# Patient Record
Sex: Male | Born: 1935 | Race: White | Hispanic: No | Marital: Married | Smoking: Former smoker
Health system: Southern US, Community
[De-identification: ages and names within clinical notes are randomized; demographics above are authoritative.]

## PROBLEM LIST (undated history)

## (undated) DIAGNOSIS — F32A Depression, unspecified: Secondary | ICD-10-CM

## (undated) DIAGNOSIS — T4145XA Adverse effect of unspecified anesthetic, initial encounter: Secondary | ICD-10-CM

## (undated) DIAGNOSIS — I499 Cardiac arrhythmia, unspecified: Secondary | ICD-10-CM

## (undated) DIAGNOSIS — N529 Male erectile dysfunction, unspecified: Secondary | ICD-10-CM

## (undated) DIAGNOSIS — I739 Peripheral vascular disease, unspecified: Secondary | ICD-10-CM

## (undated) DIAGNOSIS — T8859XA Other complications of anesthesia, initial encounter: Secondary | ICD-10-CM

## (undated) DIAGNOSIS — K219 Gastro-esophageal reflux disease without esophagitis: Secondary | ICD-10-CM

## (undated) DIAGNOSIS — F329 Major depressive disorder, single episode, unspecified: Secondary | ICD-10-CM

## (undated) HISTORY — DX: Major depressive disorder, single episode, unspecified: F32.9

## (undated) HISTORY — PX: APPENDECTOMY: SHX54

## (undated) HISTORY — PX: EYE SURGERY: SHX253

## (undated) HISTORY — DX: Peripheral vascular disease, unspecified: I73.9

## (undated) HISTORY — DX: Male erectile dysfunction, unspecified: N52.9

## (undated) HISTORY — DX: Depression, unspecified: F32.A

## (undated) HISTORY — DX: Gastro-esophageal reflux disease without esophagitis: K21.9

---

## 2003-03-10 ENCOUNTER — Ambulatory Visit: Admission: RE | Admit: 2003-03-10 | Discharge: 2003-03-10 | Payer: Self-pay | Admitting: Family Medicine

## 2010-07-28 DIAGNOSIS — K219 Gastro-esophageal reflux disease without esophagitis: Secondary | ICD-10-CM | POA: Insufficient documentation

## 2010-07-28 DIAGNOSIS — E7800 Pure hypercholesterolemia, unspecified: Secondary | ICD-10-CM

## 2010-07-28 DIAGNOSIS — F411 Generalized anxiety disorder: Secondary | ICD-10-CM | POA: Insufficient documentation

## 2010-07-28 DIAGNOSIS — I10 Essential (primary) hypertension: Secondary | ICD-10-CM | POA: Insufficient documentation

## 2010-07-28 DIAGNOSIS — F329 Major depressive disorder, single episode, unspecified: Secondary | ICD-10-CM

## 2010-07-28 DIAGNOSIS — I739 Peripheral vascular disease, unspecified: Secondary | ICD-10-CM | POA: Insufficient documentation

## 2010-07-28 DIAGNOSIS — N529 Male erectile dysfunction, unspecified: Secondary | ICD-10-CM

## 2010-07-29 ENCOUNTER — Ambulatory Visit: Payer: Self-pay | Admitting: Pulmonary Disease

## 2010-07-29 DIAGNOSIS — J984 Other disorders of lung: Secondary | ICD-10-CM

## 2011-01-18 NOTE — Assessment & Plan Note (Signed)
Summary: consult for pulmonary nodules   Copy to:  [name] Primary [name]/Referring [name]:  [name]  CC:  Pulmonary Consult.  History of Present Illness: The pt is a 77 y/o male who I have been asked to see for pulmonary nodules.  He has had a recent cxr, which showed pulmonary nodules, and his film has been reviewed on disc.  He denies any pulmonary symptoms such as cough, chest pain, hemoptysis.  He is eating well, and has not had any weight loss.  He does have a long h/o tobacco abuse, and has changed to cigars since 1994.  He denies any h/o TB exposure, and has unknown PPD status.  He has never lived in [place] or [place].  He did work for a 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    Current Medications (verified): 1)  Blood Pressure Medicine (Pt Unsure of The Name) .... Take 1 Tablet By Mouth Once A Day  Allergies (verified): No Known Drug Allergies  Past History:  Past Medical History: Reviewed history from 07/28/2010 and no changes required. Current Problems:  GERD (ICD-530.81) ANXIETY (ICD-300.00) DEPRESSION (ICD-311) ERECTILE DYSFUNCTION, ORGANIC (ICD-607.84) PERIPHERAL VASCULAR DISEASE (ICD-443.9) HYPERTENSION (ICD-401.9) HYPERCHOLESTEROLEMIA (ICD-272.0)    Past Surgical History: appendectomy at age 82  Family History: Reviewed history and no changes required. cancer: father (lung) deceased at age 14  Social History: Reviewed history from 07/28/2010 and no changes required. former smoker.  quit smoking cigarettes in 1994.  started at age 64.  2 ppd.  now smokes 5 cigars daily pt is retired from [employer]. pt is married. pt has children:  2 boys and 3 girls  Review of Systems       The patient complains of loss of appetite and sneezing.  The patient denies shortness of breath with activity, shortness of breath at rest, productive cough, non-productive cough, coughing up blood, chest pain, irregular  heartbeats, acid heartburn, indigestion, weight change, abdominal pain, difficulty swallowing, sore throat, tooth/dental problems, headaches, nasal congestion/difficulty breathing through nose, itching, ear ache, anxiety, depression, hand/feet swelling, joint stiffness or pain, rash, change in color of mucus, and fever.    Vital Signs:  Patient profile:   77 year old male Height:      71 inches Weight:      169 pounds BMI:     23.66 O2 Sat:      96 % on Room air Temp:     98.0 degrees F oral Pulse rate:   70 / minute BP sitting:   114 / 80  (left arm) Cuff size:   regular  Vitals Entered By: Arman Filter LPN (July 29, 2010 9:28 AM)  O2 Flow:  Room air CC: Pulmonary Consult Comments Medications reviewed with patient Arman Filter LPN  July 29, 2010 9:28 AM    Physical Exam  General:  wd male in nad Eyes:  PERRLA and EOMI.   Nose:  patent without discharge Mouth:  clear, with no lesions or exudates. Neck:  no jvd, tmg, LN Lungs:  decreased bs, but no wheezing or rhonchi Heart:  irregular rhythm, but controlled ventricular response. Abdomen:  soft and nontender, bs+ Extremities:  no signficant edema, pulses intact no cyanosis Neurologic:  alert and oriented, moves all 4.   Impression & Recommendations:  Problem # 1:  PULMONARY NODULE (ICD-518.89) the pt has pulmonary nodule on recent cxr of unknown significance.  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  I have recommended to the pt that he have a ct chest to better characterize the abnormality, especially given his long smoking history.  Once we have an idea of what we are dealing with, can followup further by chest xray.  Will also need to make sure his health maintenance is up to date.  The pt is hesitant to have ct chest, but will think about it and let me know.  If he decides against, will need a followup cxr in 6mos.    Medications Added to  Medication List This Visit: 1)  Blood Pressure Medicine (pt Unsure of The Name)  .... Take 1 tablet by mouth once a day  Other Orders: Consultation Level IV (10272)  Patient Instructions: 1)  consider doing ct scan of chest for evaluation, and let me know.  if you decide against doing so, will need followup cxr in 6mos.  Please call the office to arrange in Feb 2012. 2)  please call if any change in your pulmonary symptoms

## 2011-01-19 ENCOUNTER — Encounter: Payer: Self-pay | Admitting: Pulmonary Disease

## 2011-01-27 ENCOUNTER — Telehealth: Payer: Self-pay | Admitting: Pulmonary Disease

## 2011-02-03 NOTE — Progress Notes (Addendum)
Summary: schedule cxr/ct  ---- Converted from flag ---- ---- 01/27/2011 1:05 PM, Barbaraann Share MD wrote: Edward Millet, let [name] know that he is due for a f/u cxr for his lung spots.  I still think he needs ct, but he did not want to do so.  see if you can talk him into it. ------------------------------  [id number],..... Edward Millet Reynolds LPN  January 27, 2011 4:13 PM  called and spoke with [name].  [name] states his pcp, Dr. [name], called him and scheduled him for the 6 month f/u cxr which [name] recently had.  [name] states he was told there was no change in cxrs.  Called Dr. Michaelle Copas office and requested cxr report.  Edward Millet Reynolds LPN  January 28, 2011 8:56 AM    received cxr results and put in [name]'s very important look at folder.  Edward Millet Reynolds LPN  January 28, 2011   [name] needs a repeat cxr in 6mos.    2:03 PM   Appended Document: schedule cxr/ct please let [name] know that he needs a f/u cxr in 6mos.  Appended Document: schedule cxr/ct called and spoke with [name].  [name] aware of [name]'s recs to schedule f/u cxr in 6 months.  [name] WCB to schedule this.

## 2011-08-04 ENCOUNTER — Ambulatory Visit (HOSPITAL_COMMUNITY)
Admission: RE | Admit: 2011-08-04 | Discharge: 2011-08-04 | Disposition: A | Discharge status: Home / Self Care | Payer: MEDICARE | Source: Ambulatory Visit | Attending: Interventional Cardiology | Admitting: Interventional Cardiology

## 2011-08-04 DIAGNOSIS — I708 Atherosclerosis of other arteries: Secondary | ICD-10-CM | POA: Insufficient documentation

## 2011-08-04 DIAGNOSIS — I70219 Atherosclerosis of native arteries of extremities with intermittent claudication, unspecified extremity: Secondary | ICD-10-CM | POA: Insufficient documentation

## 2011-08-22 ENCOUNTER — Observation Stay (HOSPITAL_COMMUNITY)
Admission: EM | Admit: 2011-08-22 | Discharge: 2011-08-23 | Disposition: A | Discharge status: Home / Self Care | Payer: Medicare Other | Attending: Internal Medicine | Admitting: Internal Medicine

## 2011-08-22 ENCOUNTER — Emergency Department (HOSPITAL_COMMUNITY): Payer: Medicare Other

## 2011-08-22 DIAGNOSIS — F329 Major depressive disorder, single episode, unspecified: Secondary | ICD-10-CM | POA: Insufficient documentation

## 2011-08-22 DIAGNOSIS — D45 Polycythemia vera: Secondary | ICD-10-CM | POA: Insufficient documentation

## 2011-08-22 DIAGNOSIS — R339 Retention of urine, unspecified: Secondary | ICD-10-CM | POA: Insufficient documentation

## 2011-08-22 DIAGNOSIS — F17200 Nicotine dependence, unspecified, uncomplicated: Secondary | ICD-10-CM | POA: Insufficient documentation

## 2011-08-22 DIAGNOSIS — I70209 Unspecified atherosclerosis of native arteries of extremities, unspecified extremity: Secondary | ICD-10-CM | POA: Insufficient documentation

## 2011-08-22 DIAGNOSIS — I10 Essential (primary) hypertension: Secondary | ICD-10-CM | POA: Insufficient documentation

## 2011-08-22 DIAGNOSIS — N3943 Post-void dribbling: Secondary | ICD-10-CM | POA: Insufficient documentation

## 2011-08-22 DIAGNOSIS — F3289 Other specified depressive episodes: Secondary | ICD-10-CM | POA: Insufficient documentation

## 2011-08-22 DIAGNOSIS — E860 Dehydration: Principal | ICD-10-CM | POA: Insufficient documentation

## 2011-08-22 LAB — POCT I-STAT, CHEM 8
BUN: 15 mg/dL (ref 6–23)
Calcium, Ion: 1.08 mmol/L — ABNORMAL LOW (ref 1.12–1.32)
Creatinine, Ser: 1 mg/dL (ref 0.50–1.35)
Glucose, Bld: 130 mg/dL — ABNORMAL HIGH (ref 70–99)
Hemoglobin: 19.4 g/dL — ABNORMAL HIGH (ref 13.0–17.0)
Sodium: 135 mEq/L (ref 135–145)
TCO2: 22 mmol/L (ref 0–100)

## 2011-08-22 LAB — URINALYSIS, ROUTINE W REFLEX MICROSCOPIC
Glucose, UA: 100 mg/dL — AB
Ketones, ur: NEGATIVE mg/dL
Leukocytes, UA: NEGATIVE
Protein, ur: NEGATIVE mg/dL
Urobilinogen, UA: 1 mg/dL (ref 0.0–1.0)

## 2011-08-22 LAB — CK TOTAL AND CKMB (NOT AT ARMC)
CK, MB: 2.3 ng/mL (ref 0.3–4.0)
Total CK: 53 U/L (ref 7–232)

## 2011-08-22 LAB — DIFFERENTIAL
Basophils Absolute: 0.1 10*3/uL (ref 0.0–0.1)
Basophils Relative: 1 % (ref 0–1)
Eosinophils Absolute: 0.2 10*3/uL (ref 0.0–0.7)
Eosinophils Relative: 2 % (ref 0–5)
Monocytes Absolute: 0.7 10*3/uL (ref 0.1–1.0)
Monocytes Relative: 7 % (ref 3–12)

## 2011-08-22 LAB — CBC
MCH: 31 pg (ref 26.0–34.0)
MCHC: 36.1 g/dL — ABNORMAL HIGH (ref 30.0–36.0)
RDW: 14.1 % (ref 11.5–15.5)

## 2011-08-22 LAB — POCT I-STAT TROPONIN I: Troponin i, poc: 0.01 ng/mL (ref 0.00–0.08)

## 2011-08-23 LAB — URINE CULTURE
Colony Count: NO GROWTH
Culture: NO GROWTH

## 2011-08-23 NOTE — H&P (Signed)
NAMENICODEMUS, DENK NO.:  [id number]  MEDICAL RECORD NO.:  [id number]  LOCATION:  [facility]                         FACILITY:  [facility]  PHYSICIAN:  Eduard Clos, MDDATE OF BIRTH:  1934-05-23  DATE OF ADMISSION:  08/22/2011 DATE OF DISCHARGE:                             HISTORY & PHYSICAL   PRIMARY CARE PHYSICIAN:  Dario Guardian, MD  CHIEF COMPLAINT:  Dehydration.  HISTORY OF PRESENTING ILLNESS:  A 77 year old male who was recently diagnosed with peripheral artery disease and had a recent abdominal aortogram and pelvic angiogram, was placed on cilostazol.  The patient also has history of hypertension and also depression for which the patient was just recently started a week ago with desvenlafaxine.  Has not been eating well as the patient was getting more depressed.  The patient's wife states that the patient has been depressed over the last few months after his daughter had died and really after the procedure for his peripheral arterial disease a couple of  weeks ago his depression even worsened and he has not been eating well.  He was feeling weak and tired, not urinating enough, so he was brought to ER. In the ER, the patient was found to be orthostatic.  He has already given 4 liters of fluid bolus now and he will be admitted for dehydration.  The patient denies any chest pain, shortness of breath, nausea, vomiting, abdominal pain, dysuria, discharge, or diarrhea.  Denies any loss of consciousness or any dizziness or any focal deficit.  Denies any difficulties seeing, speaking, or swallowing.  PAST MEDICAL HISTORY: 1. Hypertension. 2. Recently diagnosed depression.  Was started on Pristiq. 3. Recently diagnosed peripheral arterial disease, status post     abdominal and pelvic aortogram.  Started on cilostazol.  MEDICATIONS PRIOR TO ADMISSION:  The patient is on cilostazol, lisinopril, Pristiq, fish oil, aspirin.  ALLERGIES:  No known drug  allergies.  FAMILY HISTORY:  Negative for any heart disease, stroke, diabetes.  SOCIAL HISTORY:  The patient is married.  Denies smoking cigarettes, drinking alcohol, or using illegal drugs.  REVIEW OF SYSTEMS:  As present in the history of presenting illness, nothing else significant.  PHYSICAL EXAMINATION:  GENERAL:  The patient examined at bedside, not in acute distress. VITAL SIGNS:  Blood pressure 150/88, pulse is 70 per minute, temperature 97.9, respirations 18 per minute, O2 sat 97%. HEENT:  Anicteric.  No pallor.  No discharge from ears, eyes, nose, and mouth. CHEST:  Bilateral air entry present.  No rhonchi.  No crepitation. HEART:  S1 and S2 heard. ABDOMEN:  Soft, nontender.  Bowel sounds heard. CNS:  The patient alert, awake, and oriented to time, place, and person. Moves upper and lower extremities 5/5. EXTREMITIES:  Peripheral pulses felt.  No edema.  LABORATORY DATA:  Chest x-ray shows emphysematous changes and scattered fibrosis in the lungs.  No evidence of active pulmonary disease.  CBC, WBCs 11, hemoglobin is 19.4, hematocrit is 57, platelets 332. Basic metabolic panel, sodium 135, potassium 3.5, chloride 101, glucose 130, BUN 15, bicarb was 22.  CK is 53, MB is 2.3, troponin 0.01.  UA is negative for nitrites and leukocytes, urine glucose 100,  ketones negative.  ASSESSMENT: 1. Dehydration. 2. Depression. 3. Polycythemia. 4. Peripheral arterial disease. 5. Pulmonary fibrosis.  PLAN: 1. At this time, we will admit the patient to telemetry. 2. For his dehydration, the patient has already received 4 liters of     normal saline.  The patient was found to be orthostatic in the ER.     Presently, we will continue IV fluids.  We will check strict intake     and output and repeat labs again in a.m.  We should watch out for     any urinary retention after his Foley catheter has been removed.     Presently, we feel he was dehydrated and that is the reason he  was     not able to make urine, but if he has difficulty after removing the     Foley, then we may have to also put in for any urinary retention. 3. depression.  The patient was just recently started on Pristiq.  His     depression is probably causing his poor oral intake which may     eventually improve, but if his oral intake does not improve even     after Pristiq, then he needs further workup as an outpatient with     his primary care physician. 4. History of hypertension.  Presently, I am holding off lisinopril     until his blood pressure is more stable.  I will keep the patient     on p.r.n. hydralazine for blood pressure more than 160. 5. Peripheral arterial disease.  We will continue with cilostazol and     aspirin. 6. Polycythemia, probably from severe dehydration.  We will recheck     CBC again tomorrow morning.  I think with the hydration the     hemoglobin will improve.  If it still shows high level, then he     will need further workup on that. 7. Further recommendations as condition evolves.     Eduard Clos, MD    ANK/MEDQ  D:  08/22/2011  T:  08/22/2011  Job:  [id number]  cc:   Dario Guardian, M.D.  Electronically Signed by Midge Minium MD on 08/23/2011 09:50:54 AM

## 2011-09-11 ENCOUNTER — Inpatient Hospital Stay (HOSPITAL_COMMUNITY)
Admission: EM | Admit: 2011-09-11 | Discharge: 2011-09-12 | DRG: 948 | Discharge status: Against Medical Advice | Payer: Medicare Other | Attending: Internal Medicine | Admitting: Internal Medicine

## 2011-09-11 DIAGNOSIS — Z79899 Other long term (current) drug therapy: Secondary | ICD-10-CM

## 2011-09-11 DIAGNOSIS — L509 Urticaria, unspecified: Secondary | ICD-10-CM | POA: Diagnosis present

## 2011-09-11 DIAGNOSIS — F17200 Nicotine dependence, unspecified, uncomplicated: Secondary | ICD-10-CM | POA: Diagnosis present

## 2011-09-11 DIAGNOSIS — F341 Dysthymic disorder: Secondary | ICD-10-CM | POA: Diagnosis present

## 2011-09-11 DIAGNOSIS — K5900 Constipation, unspecified: Secondary | ICD-10-CM | POA: Diagnosis present

## 2011-09-11 DIAGNOSIS — D45 Polycythemia vera: Secondary | ICD-10-CM | POA: Diagnosis present

## 2011-09-11 DIAGNOSIS — I739 Peripheral vascular disease, unspecified: Secondary | ICD-10-CM | POA: Diagnosis present

## 2011-09-11 DIAGNOSIS — R4182 Altered mental status, unspecified: Principal | ICD-10-CM | POA: Diagnosis present

## 2011-09-11 DIAGNOSIS — R040 Epistaxis: Secondary | ICD-10-CM | POA: Diagnosis present

## 2011-09-11 DIAGNOSIS — I10 Essential (primary) hypertension: Secondary | ICD-10-CM | POA: Diagnosis present

## 2011-09-11 DIAGNOSIS — Z7982 Long term (current) use of aspirin: Secondary | ICD-10-CM

## 2011-09-12 ENCOUNTER — Inpatient Hospital Stay (HOSPITAL_COMMUNITY): Payer: Medicare Other

## 2011-09-12 ENCOUNTER — Emergency Department (HOSPITAL_COMMUNITY): Payer: Medicare Other

## 2011-09-12 LAB — COMPREHENSIVE METABOLIC PANEL
ALT: 15 U/L (ref 0–53)
AST: 17 U/L (ref 0–37)
Albumin: 3.9 g/dL (ref 3.5–5.2)
Calcium: 9.4 mg/dL (ref 8.4–10.5)
GFR calc Af Amer: >60 mL/min (ref >60)
Sodium: 135 mEq/L (ref 135–145)
Total Protein: 6.9 g/dL (ref 6.0–8.3)

## 2011-09-12 LAB — POCT I-STAT TROPONIN I: Troponin i, poc: 0 ng/mL (ref 0.00–0.08)

## 2011-09-12 LAB — DIFFERENTIAL
Basophils Absolute: 0.1 10*3/uL (ref 0.0–0.1)
Basophils Relative: 1 % (ref 0–1)
Lymphocytes Relative: 15 % (ref 12–46)
Monocytes Relative: 6 % (ref 3–12)
Neutro Abs: 8 10*3/uL — ABNORMAL HIGH (ref 1.7–7.7)
Neutrophils Relative %: 78 % — ABNORMAL HIGH (ref 43–77)

## 2011-09-12 LAB — RPR: RPR Ser Ql: NONREACTIVE

## 2011-09-12 LAB — CBC
Hemoglobin: 17.2 g/dL — ABNORMAL HIGH (ref 13.0–17.0)
RBC: 5.46 MIL/uL (ref 4.22–5.81)

## 2011-09-12 LAB — PROTIME-INR
INR: 1 (ref 0.00–1.49)
Prothrombin Time: 13.4 seconds (ref 11.6–15.2)

## 2011-09-12 LAB — OCCULT BLOOD, POC DEVICE: Fecal Occult Bld: NEGATIVE

## 2011-09-12 LAB — URINALYSIS, ROUTINE W REFLEX MICROSCOPIC
Bilirubin Urine: NEGATIVE
Hgb urine dipstick: NEGATIVE
Nitrite: NEGATIVE
Specific Gravity, Urine: 1.019 (ref 1.005–1.030)
pH: 5.5 (ref 5.0–8.0)

## 2011-09-12 LAB — AMMONIA: Ammonia: <10 umol/L — ABNORMAL LOW (ref 11–60)

## 2011-09-14 NOTE — Procedures (Signed)
NAME:  ABDULHAMID, OLGIN NO.:  [id number]  MEDICAL RECORD NO.:  [id number]  LOCATION:  [facility]                         FACILITY:  [facility]  PHYSICIAN:  Corky Crafts, MDDATE OF BIRTH:  10/22/1934  DATE OF PROCEDURE:  08/04/2011 DATE OF DISCHARGE:  08/04/2011                   PERIPHERAL VASCULAR INVASIVE PROCEDURE   PRIMARY CARE PHYSICIAN:  Dario Guardian, MD  PROCEDURES PERFORMED: 1. Abdominal aortogram. 2. Pelvic angiogram. 3. Bilateral lower extremity angiogram.  OPERATOR:  Corky Crafts, MD  INDICATIONS:  Bilateral claudication.  PROCEDURE NARRATIVE:  The risks and benefits of the peripheral vascular angiography were explained to the patient and informed consent was obtained.  He was brought to the cath lab.  He was prepped and draped in the usual sterile fashion.  His left groin was infiltrated with 1% lidocaine.  A 5-French sheath was placed into the left common femoral artery using the modified Seldinger technique.  The pigtail catheter was advanced in to the abdominal aorta and a power injection of contrast was performed.  Catheter was withdrawn to the aortoiliac bifurcation and power injection of contrast was performed there.  A bilateral lower extremity runoff was done.  Several isolated iliac artery angiograms were performed as well using the same catheter.  The sheath was removed and manual compression was used to obtain hemostasis.  We chose the left groin because the patient felt like his right leg claudication was worse than the left leg.  FINDINGS: 1. Abdominal aorta shows ectasia in the infrarenal abdominal aorta. 2. There are bilateral single renal arteries.  The left renal artery     is widely patent.  The right renal has a 25% proximal stenosis. 3. Aortoiliac bifurcation is widely patent. 4. Left common iliac is widely patent.  The left external iliac artery     has a 70% stenosis.  The left internal iliac artery is  diffusely     diseased. 5. The right common iliac artery is widely patent.  The right external     iliac artery has a 70% calcified lesion and the right internal     iliac artery is widely patent. 6. Bilateral common femoral arteries are widely patent. 7. Right lower extremity.  The SFA is occluded proximally.  The     profunda femoral artery provides collaterals.  The distal SFA has a     long segment of occlusion.  This vessel reconstitutes at [facility]     canal.  The popliteal artery is patent.  The right posterior tibial     artery is large and patent.  The right anterior tibial artery has     an 80% mid lesion.  The right peroneal artery is patent. 8. Left leg SFA has proximal diffuse disease up to 70% stenosis.  In     the distal SFA, there is an occlusion at [facility] canal.  This is a     significantly shorter occlusion than what is on the right SFA.  The     left posterior tibial artery is widely patent.  The left peroneal     artery is patent.  The left anterior tibial artery is small but     patent.  IMPRESSION: 1. Bilateral  external iliac disease, moderate degree. 2. Long right superficial femoral artery occlusion with preserved 3-     vessel runoff.  There is a right anterior tibial lesion as well. 3. Left leg shows a shorter distal superficial femoral artery     occlusion with more moderate disease proximally.  He has preserved     3-vessel runoff.  RECOMMENDATIONS:  The right SFA lesion is so long.  It seems unlikely that any percutaneous intervention would give a long-lasting result. The left SFA occlusion is more amenable to percutaneous attempt, however, we went in through the left common femoral artery and therefore could not attempt this today.  We will discuss what the patient wishes to do.  For now, will continue medical therapy.  I think Vascular Surgery consultation would be reasonable if he does want to pursue revascularization given the length of the right SFA  occlusion.  He currently has no rest pain and no tissue loss.  This was all explained to the patient and his wife.  At this point, the patient feels like his symptoms are manageable.  He is leery about undergoing any surgery.  I will follow up with him in the office.     Corky Crafts, MD     JSV/MEDQ  D:  08/15/2011  T:  08/15/2011  Job:  [id number]  Electronically Signed by Lance Muss MD on 09/14/2011 01:07:48 PM

## 2011-09-18 NOTE — H&P (Signed)
[name], [name] NO.:  [id number]  MEDICAL RECORD NO.:  [id number]  LOCATION:  4705                         FACILITY:  [facility]  PHYSICIAN:  Eduard Clos, MDDATE OF BIRTH:  08/07/1934  DATE OF ADMISSION:  09/11/2011 DATE OF DISCHARGE:                             HISTORY & PHYSICAL   PRIMARY CARE PHYSICIAN:  Dario Guardian, MD  CHIEF COMPLAINT:  Confusion.  HISTORY OF PRESENTING ILLNESS:  A 77 year old male who was recently seen in the hospital on September 4 after being treated for dehydration and he also was found to have depression, presents with increasing confusion over the last 2 weeks.  The patient's wife was brought in the history states that he is being increasingly confused and sometime talking about bills at home and sometimes not making sense.  In the ER, when he was brought he was found to be slightly agitated, was given Ativan 2 mg IV and became more better.  The patient at this time is more oriented, but I am talking to him, suddenly he speaks something which is not making sense.  Sometimes, he is completely oriented.  He did not have any headache or fever or chills.  Did not lose consciousness or did not have any focal deficit.  He has been having some constipation.  He did not have any nausea, vomiting or any abdominal pain.  No dysuria, discharge or diarrhea.  The patient was started Pristiq recently, a month and a half ago and recently when he went back to his primary care physician, he was started on Seroquel 10 days ago, but his wife has still not given it and not sure if it is going to help him.  The patient did not have any hallucinations nor any delusions.  In the ER, when the patient was brought by his wife in the car, he developed some hives and itching, which is on his abdomen and his hands, for which a dose of Benadryl was given and it improved, but still the hives persisted on his abdomen and slightly on his inner  aspect of his arms.  The patient did not have any shortness of breath or any swelling of his tongue.  PAST MEDICAL HISTORY: 1. Hypertension. 2. Recently diagnosed depression. 3. Peripheral artery disease.  The patient was started on cilostazol. 4. Recently for admitted for dehydration, polycythemia, and smokes     cigars.  MEDICATIONS:  The patient is on: 1. Flomax 0.4 mg. 2. Lisinopril 5 mg. 3. Aspirin 81 mg. 4. Cilostazol 50 mg. 5. Fish oil. 6. Pristiq. 7. Seroquel was started which the patient was not taking  ALLERGIES:  No known drug allergies.  FAMILY HISTORY:  Negative for any heart disease, stroke or diabetes. The patient's daughter had depression.  SOCIAL HISTORY:  The patient is married, smokes cigar.  Does not drink alcohol or use illegal drugs.  REVIEW OF SYSTEMS:  As per history of presenting illness, nothing else significant.  PHYSICAL EXAMINATION:  GENERAL:  The patient examined at bedside, not in acute distress. VITAL SIGNS:  Blood pressure 149/85, pulse 80 per minute, temperature 97.6, respirations 18 per minute, O2 sat 100%. HEENT:  Anicteric.  No pallor.  No facial asymmetry.  Tongue is midline. CHEST:  Bilateral air entry present.  No rhonchi, no crepitation. HEART:  S1 and S2 heard. ABDOMEN:  Soft.  There is some rash in the lower part of his abdomen. Abdomen is nontender.  No guarding or rigidity.  Bowel sounds are present. CNS:  Alert, awake, and oriented to time, place, and person.  Moves upper and lower extremities, 5/5.  There is no muscle rigidity.  No clonus.  No hyperreflexia. EXTREMITIES:  Peripheral pulses felt.  No edema.  LABORATORY DATA:  EKG shows sinus rhythm with T-waves which is slightly pronounced, comparable to the old EKG.  Heart rate is around 81 beats per minute.  Acute abdomen series shows large amount of stool within the right side of the abdomen and pelvis, likely reflecting mild constipation.  Otherwise, unremarkable  bowel gas pattern.  No free intra- abdominal air seen.  Mild bilateral lower lung zone opacity, likely reflecting atelectasis.  CT head without contrast shows no acute intracranial pathology seen on CT, mild cortical volume loss, and scattered small vessel ischemic microangiopathy, small chronic lacunar infarcts within the basal ganglia bilaterally, partial opacification of the left mastoid air cells.  Cerumen noted within next auditory canals bilaterally.  CBC:  WBC is 10.3, hemoglobin 17.2, hematocrit is 47.1, platelets 383. PT/INR is 13.4 and 1.  Complete metabolic panel:  Sodium 135, potassium 3.6, chloride 101, carbon dioxide 25, glucose 123, BUN 19 creatinine 1, total bilirubin is 0.4, alkaline phosphatase 78, AST 17, ALT 15, total protein 6.9, albumin 3.9, and calcium 9.4.  Troponin negative.  UA is negative.  Fecal blood is negative.  ASSESSMENT: 1. Delirium 2. Depression. 3. Periods of epistaxis. 4. Hives. 5. Polycythemia. 6. Cigarette smoking. 7. Hypertension. 8. Peripheral vascular disease.  PLAN: 1. At this time, we will admit the patient to telemetry. 2. For his delirium at this time. we will check ammonia levels, RPR,     TSH.  We will also get an MRI of the brain.  We need to verify his     home medication.  If his MRI is negative and all his labs are not     showing anything acute, the patient will benefit from a psychiatric     consult. 3. Polycythemia.  At this time, will be ordered an erythropoietin     level as suggestion of the last discharge summary is possibly could     be from his tobacco abuse.  I am also going to get a peripheral     smear. 4. Hives.  At this time, I do not know exactly what is causing the     hives.  We may try to hold off his lisinopril for now and for his     blood pressure, keeping the patient on p.r.n. IV hydralazine, may     probably start on amlodipine for his blood pressure needs. 5. Constipation.  I am going to check TSH and  also give a dose of     soapsuds edema at this time. 6. The patient was noticed to have epistaxis.  We will closely follow     him in the hospital at this time.  We are getting MRI of the brain     for his delirium and also if there is any significant epistaxis, we     may call ENT during the stay, otherwise the patient need a referral     as outpatient.  7.  Further  recommendation as condition evolves.     Eduard Clos, MD     ANK/MEDQ  D:  09/12/2011  T:  09/12/2011  Job:  [id number]  cc:   Dario Guardian, M.D.  Electronically Signed by Midge Minium MD on 09/18/2011 12:00:41 PM

## 2011-09-22 NOTE — Discharge Summary (Signed)
[name], LEDO NO.:  [id number]  MEDICAL RECORD NO.:  [id number]  LOCATION:  4705                         FACILITY:  [facility]  PHYSICIAN:  Isidor Holts, M.D.  DATE OF BIRTH:  12/11/1934  DATE OF ADMISSION:  09/11/2011 DATE OF DISCHARGE:  09/12/2011                              DISCHARGE SUMMARY   DISCHARGE DIAGNOSES: 1. Altered mental status/confusion. 2. Urticaria. 3. Depression. 4. Hypertension. 5. Peripheral vascular disease. 6. Benign prostatic hypertrophy. 7. Smoking history. 8. Polycythemia. 9. Constipation. 10.Transient epistaxis.  DISCHARGE MEDICATIONS:  None as the patient left AMA.  PROCEDURES: 1. Head CT scan September 12, 2011.  This showed no acute intracranial     pathology.  There was mild cortical volume loss and scattered small     vessel ischemic microangiopathy, small chronic lacunar infarcts in     the basal ganglia bilaterally, partial opacification of the left     mastoid air cells.  Serum seen in the external auditory canals     bilaterally. 2. Abdominal/chest x-ray September 12, 2011.  This showed large amount     of stool within the right side of the abdomen and pelvis likely     reflecting mild constipation, otherwise unremarkable bowel gas     pattern.  No free intra-abdominal air seen.  Mild bilateral lower     lung opacity which likely reflects atelectasis.  CONSULTATIONS:  None.  CLINICAL COURSE:  This is a 77 year old male, with known history of hypertension, recently diagnosed depression, peripheral vascular disease status post hospitalization at [facility] from August 22, 2011, to August 23, 2011, for dehydration, hypotension with orthostasis, prostatism and secondary polycythemia, attributed to dehydration and smoking, presenting with a 2-week history of increasing confusion, and mild epistaxis.  He was found to be mildly agitated in the emergency department, but responded to Ativan  administered intravenously. Reportedly, he had been started on Pristiq about a month and half ago and then Seroquel was added on approximately 10 days prior to this presentation, although he has not yet filled this prescription.  While in the emergency department, he was noted to have hives and itching on his abdomen and hands, which responded to Benadryl.  He was then admitted for further evaluation, investigation, and management.  As of September 12, 2011, a.m. he appeared alert, communicative, not short of breath, and was fully oriented to person and place, although disoriented to the month.  He was also oriented to year and Economist.  Physical examination otherwise was quite unremarkable and certainly no urticaria was evident.  His ammonia level was less than 10.  Fecal occult blood testing was negative.  Urinalysis was negative.  LFTs were normal.  Hemoglobin was 17.2 with a hematocrit of 47.1, WBC was 10.3, platelets 383.    It seems likely that his altered mental status may be related to possible mild dementia, against a background of cerebrovascular disease.  Brain MRI was arranged.  TSH ordered.  Erythropoietin levels were also ordered to evaluate the patient's borderline polycythemia, although given the fact that his white cell count and platelet levels were within normal limits and that he is continuing to smoke, it appears likely  that this was more likely to be secondary in nature.  His blood pressure was mildly elevated at 149/85 mmHg.  Medications were therefore adjusted appropriately.  Lisinopril was placed on hold however, given his recent urticaria, although it is not clear that this was the culprit. As the patient had been constipated for 2 weeks and fecal loading was noted on abdominal x-rays, he was commenced on laxatives.  He had no recurrence of epistaxis during his hospitalization.  DISPOSITION:  Unfortunately, these management measures could not be completed,  as the patient's spouse took him away AMA, later in the day on September 12, 2011.     Isidor Holts, M.D.     CO/MEDQ  D:  09/21/2011  T:  09/21/2011  Job:  [id number]  cc:   Dario Guardian, M.D.  Electronically Signed by Isidor Holts M.D. on 09/22/2011 10:52:56 AM

## 2011-09-29 NOTE — Discharge Summary (Signed)
[name], [name] NO.:  [id number]  MEDICAL RECORD NO.:  [id number]  LOCATION:  2007                         FACILITY:  [facility]  PHYSICIAN:  Rosanna Randy, MDDATE OF BIRTH:  [birth year]  DATE OF ADMISSION:  08/22/2011 DATE OF DISCHARGE:  08/23/2011                              DISCHARGE SUMMARY   DISCHARGE DIAGNOSES: 1. Dehydration. 2. Hypertension. 3. Depression, recently started on Pristiq. 4. Orthostatic changes due to dehydration. 5. Symptoms of difficulty emptying his bladder and dribbling. 6. Recently diagnosed peripheral arterial disease status post     abdominal and pelvic aortogram.  The patient started on cilostazol. 7. Polycythemia most likely secondary to dehydration and also history     of smoking. 8. Tobacco abuse.  DISCHARGE MEDICATIONS: 1. Flomax 0.4 mg 1 capsule by mouth daily at bedtime. 2. Lisinopril 5 mg. 3. Aspirin 81 mg. 4. Cilostazol 50 mg.  Instructions to take 30 minutes before or 2     hours after breakfast and dinner twice a day. 5. Fish oil 1000 mg 1 capsule by mouth daily. 6. Pristiq 50 mg XR 1 tablet by mouth daily.  DISPOSITION AND FOLLOWUP:  The patient had been discharged in a stable improved condition.  Currently, not complaining of any orthostatic changes, no dizziness, no lightheadedness, taking p.o.'s without problem, drinking fluids without difficulties, and with a stable vital signs.  At this moment, the patient had been started on Flomax 0.4 mg due to the symptoms described on admission that are consistent with most likely BPH.  In order to prevent orthostatics that initially were secondary to the use of antihypertensive drugs plus the fact that he was not eating and drinking properly and was dehydrated with a concentrated urine on exam at the moment of admission.  The patient had decreased in half of his standard antihypertensive drugs, and he is going to follow with primary care physician Dr. Katrinka Blazing on  August 26, 2011, for further adjustment of his medications and in order to follow his symptoms.  It will be important at that time to repeat a basic metabolic panel in order to look at his electrolytes and also in order to look at his renal function.  PROCEDURE PERFORMED DURING THIS HOSPITALIZATION:  The patient had a chest x-ray two views that demonstrated emphysematous changes and scattered fibrosis in the lungs without any evidence of active pulmonary disease.  No other procedures were performed during this hospitalization.  LABORATORY DATA:  An i-STAT chemistry with a bicarb of 22, calcium 1.08, sodium 135, potassium 3.5, chloride 101, glucose 130, BUN 15, creatinine 1.0.  Hemoglobin 18, hematocrit 57.0.  Urinalysis demonstrated a specific gravity of 1.032.  There was no leukocytes, no nitrites, no protein.  There was just a small amount of bilirubin and 100 glucose. Microscopy was normal, and the culture demonstrated no growth.  Cardiac markers were negative x1.  HOSPITAL COURSE BY PROBLEM: 1. Dehydration.  The patient was admitted with orthostatic changes and     also with a concentrated urine.  After fluid resuscitation,     symptoms completely resolved.  At this moment, we have cut in half     the patient's lisinopril.  He had been instructed to eat and drink     and keep himself hydrated, and he is going to follow with primary     care physician for further adjustment on his antihypertensive drugs     and also for further adjustment on his depression medications. 2. Depression.  At this moment he had just been recently started on     Pristiq and according to the wife, his symptoms are slowly     improving.  He had a better attitude, and he is looking to eat and     drink and do all his activities of daily living as expected.  He     will follow with primary care physician and further     adjustment/changes on the medications dose or even complete     exchange of the  medications will be done depending improvement of     her symptoms. 3. Polycythemia most likely secondary to combination of dehydration     and also the tobacco abuse.  At this point, the patient has     received fluid resuscitation and he is going to follow with primary     care physician as an outpatient.  If despite the fluid     resuscitation and a good hydration, he continue having an elevated     hematocrit and hemoglobin.  It will be important to have a ferritin     and a transferrin level in order to rule out any other conditions     causing this polycythemia. 4. Peripheral arterial disease.  The patient was instructed to stop     smoking, and he is going to continue taking his antihypertensive     medication and his fish oil.  He is status post aortogram and will     continue using cilostazol. 5. Findings of pulmonary fibrosis all secondary to smoking.  He is     planning to do his best in order to quit smoking.  At this point,     he is not in need of any inhaler or any oxygen supplementation.  He     will continue to follow with primary care physician. 6. Hypertension.  The patient is going to continue using his    lisinopril.  We have cut in half the medication in order for him to     get just 5 mg by mouth daily due to the new addition of Flomax to     his medication regimen that could potentially exacerbate and cause     orthostatic changes.  Further adjustments are going to be done as     an outpatient. 7. Symptoms of dribbling, urinary retention, and not emptying his     bladder fully, which is most likely due to BPH.  At this point, the     patient has been started on Flomax, initially require Foley     throughout this hospitalization, now without the Foley he has been     good and feeling that his bladder is completely empty.  Further     assessment as an outpatient including the need of urology consult     are going to be decided by primary care physician.  PHYSICAL  EXAMINATION:  VITAL SIGNS:  At discharge, temperature 98.0, heart rate 65, respiratory rate 20, blood pressure 140/69, oxygen saturation 93%.  Negative orthostatic changes. GENERAL:  The patient was in no acute distress. RESPIRATORY:  No wheezing.  No crackles.  Good air movement. HEART:  With a regular rate and rhythm.  S1 and S2 appreciated.  No rubs. ABDOMEN:  Soft, nontender, nondistended.  Positive bowel sounds.  No hepatomegaly. EXTREMITIES:  No edema, significant decreased pulses bilaterally, but no cyanosis appreciated. NEUROLOGIC:  Nonfocal.     Rosanna Randy, MD     CEM/MEDQ  D:  08/23/2011  T:  08/23/2011  Job:  [id number]  cc:   Dario Guardian, M.D.  Electronically Signed by Vassie Loll MD on 09/29/2011 07:48:34 AM

## 2011-10-09 ENCOUNTER — Emergency Department (HOSPITAL_COMMUNITY): Payer: Medicare Other

## 2011-10-09 ENCOUNTER — Inpatient Hospital Stay (HOSPITAL_COMMUNITY)
Admission: EM | Admit: 2011-10-09 | Discharge: 2011-10-12 | DRG: 682 | Disposition: A | Discharge status: Home Health | Payer: Medicare Other | Attending: Internal Medicine | Admitting: Internal Medicine

## 2011-10-09 DIAGNOSIS — F3289 Other specified depressive episodes: Secondary | ICD-10-CM | POA: Diagnosis present

## 2011-10-09 DIAGNOSIS — E43 Unspecified severe protein-calorie malnutrition: Secondary | ICD-10-CM | POA: Diagnosis present

## 2011-10-09 DIAGNOSIS — Z681 Body mass index (BMI) 19 or less, adult: Secondary | ICD-10-CM

## 2011-10-09 DIAGNOSIS — N189 Chronic kidney disease, unspecified: Secondary | ICD-10-CM | POA: Diagnosis present

## 2011-10-09 DIAGNOSIS — N179 Acute kidney failure, unspecified: Principal | ICD-10-CM | POA: Diagnosis present

## 2011-10-09 DIAGNOSIS — I739 Peripheral vascular disease, unspecified: Secondary | ICD-10-CM | POA: Diagnosis present

## 2011-10-09 DIAGNOSIS — Z91199 Patient's noncompliance with other medical treatment and regimen due to unspecified reason: Secondary | ICD-10-CM

## 2011-10-09 DIAGNOSIS — Z9119 Patient's noncompliance with other medical treatment and regimen: Secondary | ICD-10-CM

## 2011-10-09 DIAGNOSIS — I129 Hypertensive chronic kidney disease with stage 1 through stage 4 chronic kidney disease, or unspecified chronic kidney disease: Secondary | ICD-10-CM | POA: Diagnosis present

## 2011-10-09 DIAGNOSIS — R627 Adult failure to thrive: Secondary | ICD-10-CM | POA: Diagnosis present

## 2011-10-09 DIAGNOSIS — D751 Secondary polycythemia: Secondary | ICD-10-CM | POA: Diagnosis present

## 2011-10-09 DIAGNOSIS — F329 Major depressive disorder, single episode, unspecified: Secondary | ICD-10-CM | POA: Diagnosis present

## 2011-10-09 DIAGNOSIS — E870 Hyperosmolality and hypernatremia: Secondary | ICD-10-CM | POA: Diagnosis present

## 2011-10-09 LAB — POCT I-STAT TROPONIN I

## 2011-10-09 LAB — COMPREHENSIVE METABOLIC PANEL
BUN: 60 mg/dL — ABNORMAL HIGH (ref 6–23)
Calcium: 9.1 mg/dL (ref 8.4–10.5)
GFR calc Af Amer: 50 mL/min — ABNORMAL LOW (ref >90)
Glucose, Bld: 100 mg/dL — ABNORMAL HIGH (ref 70–99)
Total Protein: 6.7 g/dL (ref 6.0–8.3)

## 2011-10-09 LAB — URINALYSIS, ROUTINE W REFLEX MICROSCOPIC
Ketones, ur: 15 mg/dL — AB
Nitrite: NEGATIVE
Protein, ur: NEGATIVE mg/dL
pH: 5 (ref 5.0–8.0)

## 2011-10-09 LAB — DIFFERENTIAL
Basophils Absolute: 0 10*3/uL (ref 0.0–0.1)
Eosinophils Absolute: 0 10*3/uL (ref 0.0–0.7)
Eosinophils Relative: 0 % (ref 0–5)
Lymphocytes Relative: 8 % — ABNORMAL LOW (ref 12–46)
Lymphs Abs: 1.1 10*3/uL (ref 0.7–4.0)
Monocytes Absolute: 0.7 10*3/uL (ref 0.1–1.0)

## 2011-10-09 LAB — SALICYLATE LEVEL: Salicylate Lvl: <2 mg/dL — ABNORMAL LOW (ref 2.8–20.0)

## 2011-10-09 LAB — CBC
HCT: 56.5 % — ABNORMAL HIGH (ref 39.0–52.0)
MCHC: 35.9 g/dL (ref 30.0–36.0)
MCV: 87.9 fL (ref 78.0–100.0)
RDW: 14.5 % (ref 11.5–15.5)

## 2011-10-10 LAB — BASIC METABOLIC PANEL
CO2: 27 mEq/L (ref 19–32)
GFR calc non Af Amer: 57 mL/min — ABNORMAL LOW (ref >90)
Glucose, Bld: 88 mg/dL (ref 70–99)
Potassium: 4 mEq/L (ref 3.5–5.1)
Sodium: 154 mEq/L — ABNORMAL HIGH (ref 135–145)

## 2011-10-10 LAB — CBC
Hemoglobin: 18.1 g/dL — ABNORMAL HIGH (ref 13.0–17.0)
RBC: 5.97 MIL/uL — ABNORMAL HIGH (ref 4.22–5.81)
WBC: 12 10*3/uL — ABNORMAL HIGH (ref 4.0–10.5)

## 2011-10-11 LAB — BASIC METABOLIC PANEL
CO2: 24 mEq/L (ref 19–32)
Calcium: 9.1 mg/dL (ref 8.4–10.5)
Glucose, Bld: 81 mg/dL (ref 70–99)
Sodium: 150 mEq/L — ABNORMAL HIGH (ref 135–145)

## 2011-10-11 LAB — CBC
Hemoglobin: 16.8 g/dL (ref 13.0–17.0)
MCH: 30.7 pg (ref 26.0–34.0)
RBC: 5.48 MIL/uL (ref 4.22–5.81)

## 2011-10-12 LAB — BASIC METABOLIC PANEL
BUN: 23 mg/dL (ref 6–23)
Chloride: 108 mEq/L (ref 96–112)
GFR calc non Af Amer: 70 mL/min — ABNORMAL LOW (ref >90)
Glucose, Bld: 79 mg/dL (ref 70–99)
Potassium: 3.7 mEq/L (ref 3.5–5.1)

## 2011-10-14 NOTE — H&P (Signed)
NAME:  [name], [name] NO.:  [id number]  MEDICAL RECORD NO.:  [id number]  LOCATION:  WLED                         FACILITY:  [facility]  PHYSICIAN:  Brendia Sacks, MD    DATE OF BIRTH:  Nov 04, 1934  DATE OF ADMISSION:  10/09/2011 DATE OF DISCHARGE:                             HISTORY & PHYSICAL   PRIMARY CARE PHYSICIAN:  Dario Guardian, MD.  PRIMARY CARDIOLOGIST:  Dr. Corky Crafts, MD.  REFERRING PHYSICIAN:  Dr. Nelva Nay, MD in the emergency room.  CHIEF COMPLAINT:  Dehydrated.  HISTORY OF PRESENT ILLNESS:  This is a 77 year old man presents to the emergency room with failure to thrive.  History is largely obtained from his wife sitting at the bedside as the patient is noncooperative with examination and history taking.  He makes frequent sarcastic remarks and in general, refuses to cooperate.  The patient's wife notes that she called EMT this morning because he was dehydrated.  She noted that he had taken in absolutely no fluid since yesterday.  She notes that he has not been drinking fluids because he has been generally weak and he does not want to have to get up to go to the bathroom.  She notes weight loss over the last several weeks.  The patient does have a history of depression and his wife has no suspicions of dementia.  She feels at this point that his memory is normal for his age.  She knows he has anxiety at times.  Review of the patient's records notes 2 hospitalizations of September, the first with the discharge on September 4 with diagnoses including dehydration, depression and urinary hesitancy.  The second on September 23rd to 24th when he left against medical advice at that time.  He was admitted for altered mental status at that time.  He was noted to be agitated in the emergency room.  His wife relates to me that the patient has been quite angry ever since he had catheterization done for peripheral artery disease.  He  had anticipated a stent at that time; however, one was not placed.  He has been quite angry about this since.  He and his wife expressed feelings of frustration with the medical system and general distrust of it.  They feel that they have not been informed of all the patient's testing procedures and procedures in the past.  REVIEW OF SYSTEMS:  Negative for fever, changes to his vision.  Positive for some mouth pain secondary to dehydration and negative for muscle aches or rash.  No chest pain, shortness of breath.  No nausea, vomiting, or abdominal pain.  No diarrhea, no dysuria, no bleeding.  PAST MEDICAL HISTORY: 1. Hypertension. 2. Depression. 3. Peripheral artery disease. 4. Polycythemia, thought secondary to smoking.  PAST SURGICAL HISTORY:  Appendectomy, 1962.  ALLERGIES:  None.  FAMILY HISTORY:  Negative for heart disease.  SOCIAL HISTORY:  Patient is married.  Lives here in [place].  He quit smoking cigars about a month ago.  Does not drink alcohol.  MEDICATIONS:  The patient currently is not taking any medications.  He stopped all his medications approximately 3 weeks ago.  He has previously on  Pristiq, lisinopril, fish oil, Cilostazol and aspirin.  PHYSICAL EXAMINATION:  VITAL SIGNS:  Initial blood pressure 145/93, pulse 115, respirations 20, temperature 97.7.  Recheck of pulse is now 89. GENERAL:  This is a well-developed thin male, in no acute distress.  He does not make eye contact, frequently ignores questions.  He appears to be frustrated and somewhat angry. HEENT:  Head appears to be normal.  Eyes, sclerae clear.  Pupils are equal, round, reactive to light.  Lids appear grossly normal.  ENT: Hearing is grossly normal.  Lips, mucous membranes are dry.  Poor dentition. NECK:  Supple.  No lymphadenopathy or masses.  No thyromegaly.  CHEST: Clear to auscultation bilaterally.  No wheezes, rales, or rhonchi. There is normal respiratory effort. CARDIOVASCULAR:   Regular rate and rhythm.  No murmur, rub, or gallop. No lower extremity edema. ABDOMEN:  Soft, nontender, nondistended.  It is quite thin. SKIN:  Normal without rash or induration, is nontender to palpation. PSYCHIATRIC:  The patient's mood and affect is difficult to assess.  He is frustrated and angry at this point.  He does not comply with any orientation questions.  His wife tells me that he knows the answers to these questions, but he is just refusing to answer them.  ANCILLARY STUDIES:  EKG shows sinus tachycardia, right anterior enlargement and nonspecific ST changes.  No acute changes are seen.  IMAGING:  None.  LABORATORY STUDIES: 1. CBC is notable for white blood cell count of 13.7, hemoglobin of     12.3, platelet count of 307. 2. Basic metabolic panel notable for sodium of 158, glucose of 100,     BUN of 60, creatinine of 1.52.  Acetaminophen level and salicylate     levels are negative.  Not clear why these were ordered.  Urinalysis     negative.  ASSESSMENT/PLAN:  This is a 77 year old male who presents with acute renal failure and failure to thrive. 1. Acute renal failure and failure to thrive.  This is the 3rd     hospitalization in the last month.  Dehydration has been described     in the past, but he has never had renal failure before.  Based on     history, this is secondary to very minimal intake.  I do not     suspect an infection nor problem with the kidneys at this point.     We will admit for hydration.  Ultimately, this will be difficult to     treat if the patient remains resistant to medical intervention.     Depression may be playing a role and he did stop his     antidepressant.  However, his wife has no concerns about his     mentation or his orientation.  I have offered psychiatry evaluation     to the patient and he declined this.  We will also ask physical     therapy to see and he is agreeable to this at this point. 2. Polycythemia.  Thought to be  secondary in nature, possibly related     to previous smoking.  We will repeat a CBC in the morning.     Consider outpatient followup with Oncology if the patient is     agreeable to this. 3. Hypertension, appears to be stable. 4. Peripheral artery disease, appears to be stable. 5. Noncompliance.  I have discussed admission overnight with the     family.  My plan at this point is hydration overnight  and probable     discharge tomorrow if his laboratory work appears better.  I     discussed the interventions I was offering with him and his wife     and answered all questions to the wife's apparent satisfaction.  I     gave the patient himself several opportunities to ask me questions     about his medical care and he declined.     Brendia Sacks, MD     DG/MEDQ  D:  10/09/2011  T:  10/09/2011  Job:  [id number]  cc:   Dario Guardian, M.D. Fax: [phone]  Corky Crafts, MD Fax: [phone]  Electronically Signed by Brendia Sacks  on 10/14/2011 03:02:25 PM

## 2011-10-19 NOTE — Discharge Summary (Signed)
NAMEMarland Kitchen  [name], [name] NO.:  [id number]  MEDICAL RECORD NO.:  [id number]  LOCATION:  1319                         FACILITY:  [facility]  PHYSICIAN:  Richarda Overlie, MD       DATE OF BIRTH:  08-09-1934  DATE OF ADMISSION:  10/09/2011 DATE OF DISCHARGE:  10/12/2011                              DISCHARGE SUMMARY   PRIMARY CARE PHYSICIAN:  Dario Guardian, M.D.  PRIMARY CARDIOLOGIST:  Corky Crafts, MD  DISCHARGE DIAGNOSES: 1. Depression. 2. Dehydration with acute on chronic renal insufficiency. 3. Hypernatremia secondary to dehydration. 4. Moderate protein calorie malnutrition. 5. Polycythemia. 6. Peripheral vascular disease. 7. Status post appendectomy in 1962.  DISCHARGE MEDICATIONS: 1. Aspirin 81 mg p.o. daily. 2. Cilostazol. 1 tablet p.o. daily. 3. Fish oil 1000 mg p.o. daily. 4. Pristiq 50 mg p.o. daily. Medications discontinued:  Lisinopril 10 mg p.o. daily.  PHYSICAL EXAMINATION:  VITAL SIGNS:  Blood pressure 133/71, respirations 18, pulse 57, temperature 97.6, 97% on room air. GENERAL:  Currently the patient refuses to eat and drink, but appears to be comfortable in no acute cardiopulmonary distress. HEENT:  Pupils equal and reactive.  Extraocular movements intact. NECK:  Supple.  No JVD. LUNGS:  Clear to auscultation bilaterally.  No wheezes, no crackles, no rhonchi. CARDIOVASCULAR:  Regular rate and rhythm.  No murmurs, rubs, or gallops. ABDOMEN:  Soft, nontender, nondistended. EXTREMITIES:  Without cyanosis, clubbing, or edema. NEUROLOGIC:  Cranial nerves 2-12 grossly intact.  SUBJECTIVE:  A 77 year old male who presents to the ER with failure to thrive.  The patient has been refusing to eat or drink and has been noncooperative with his entire hospital stay.  He has been refusing PT and OT evaluation.  Initially when he presented, he was found to have dehydration on physical exam as well as by lab criteria.  He did have an elevated  creatinine of 1.52 and a sodium of 150.  The patient was ruled out for infection and had a UA that was negative.  His white count was mildly elevated, but the patient did not have any cardiopulmonary symptoms, so no chest x-ray was done.  The patient was hydrated aggressively with IV fluids and his creatinine as well as his sodium improved.  However during the course of his hospitalization, the patient continues to refuse to eat.  Although the albumin is normal, the patient's BMI is 15 and the patient does have severe malnutrition and emaciation.  He would need to undergo treatment for his underlying depression.  A psychiatric consultation has been obtained and is awaited.  In the meantime, the patient will continue with his Pristiq. Psychiatry will determine if the patient will benefit from inpatient psychiatric treatment versus outpatient close followup.  At this time the patient has declined PT/OT for home.  The family is very well aware of the patient's noncompliance and all help in terms of psychiatric as well as physical therapy and occupational therapy will be offered.  To further evaluate his depression, we will also check a vitamin B12 level and a TSH.  FOLLOWUP APPOINTMENTS: 1. PCP in 5-7 days. 2. Outpatient Psychiatry.  We will need to follow up the patient  closely, if the patient goes home after psychiatric evaluation.     Richarda Overlie, MD     NA/MEDQ  D:  10/12/2011  T:  10/12/2011  Job:  [id number]  cc:   Corky Crafts, MD Fax: [phone]  Electronically Signed by Richarda Overlie MD on 10/19/2011 11:50:19 PM

## 2013-03-25 ENCOUNTER — Telehealth: Payer: Self-pay | Admitting: *Deleted

## 2013-03-25 ENCOUNTER — Encounter: Payer: Self-pay | Admitting: Neurology

## 2013-03-25 DIAGNOSIS — R413 Other amnesia: Secondary | ICD-10-CM

## 2013-03-25 DIAGNOSIS — G3184 Mild cognitive impairment, so stated: Secondary | ICD-10-CM | POA: Insufficient documentation

## 2013-03-25 MED ORDER — CITALOPRAM HYDROBROMIDE 20 MG PO TABS: 20.0000 mg | ORAL_TABLET | Freq: Every day | ORAL | Status: DC

## 2013-03-25 MED ORDER — CEREFOLIN NAC 6-2-600 MG PO TABS: 1.0000 | ORAL_TABLET | Freq: Every day | ORAL | Status: DC

## 2013-03-25 NOTE — Telephone Encounter (Signed)
Okay to refill? 

## 2013-03-25 NOTE — Telephone Encounter (Signed)
Patient wife is calling to get in appointment for the patient , gave it to her and she need a new referral to get an EEG.

## 2013-03-25 NOTE — Telephone Encounter (Signed)
Message copied by Malachy Moan on Mon Mar 25, 2013  4:49 PM ------      Message from: Salome Spotted      Created: Mon Mar 25, 2013  3:18 PM       Hey this patient needs a refill on his medication until he come sin for his appointment . Which is in 8/14 ------

## 2013-03-29 ENCOUNTER — Other Ambulatory Visit (INDEPENDENT_AMBULATORY_CARE_PROVIDER_SITE_OTHER): Payer: Medicare Other | Admitting: Radiology

## 2013-03-29 DIAGNOSIS — R413 Other amnesia: Secondary | ICD-10-CM

## 2013-06-14 ENCOUNTER — Telehealth: Payer: Self-pay | Admitting: Neurology

## 2013-06-14 NOTE — Telephone Encounter (Signed)
Called with normal EEG results.

## 2013-07-22 ENCOUNTER — Ambulatory Visit (INDEPENDENT_AMBULATORY_CARE_PROVIDER_SITE_OTHER): Payer: Medicare Other | Admitting: Neurology

## 2013-07-22 ENCOUNTER — Encounter: Payer: Self-pay | Admitting: Neurology

## 2013-07-22 VITALS — BP 125/74 | HR 94 | Temp 98.6°F

## 2013-07-22 DIAGNOSIS — R413 Other amnesia: Secondary | ICD-10-CM

## 2013-07-22 NOTE — Progress Notes (Signed)
[facility] [address] [place]. [place] [address] [phone]       OFFICE FOLLOW-UP NOTE  [name] Date of Birth:  03-12-1934 Medical Record Number:  [id number]   HPI: 79 year old male with memory difficulties due to mild cognitive impairment likely due to underlying untreated depression. 07/22/13 he returns for followup of her last visit with me on 10/04/12. She continues to have memory difficulties which are not significantly worsened. The patient's wife states that she'll notice some improvement after taking the cerefolin nac  . He however has stopped his Celexa and is not willing to try other medications for depression. He did have a lab work that I ordered on 10/04/12 and TSH, B12, RPR were normal and homocystine was elevated at 18.4 mol/liter. EEG on 03/29/13 was normal. MRI scan of the brain done on 10/15/12 showed moderate changes of chronic back first to ischemia and generalized cerebral atrophy. The patient's wife seems fixated on the fact that his symptoms began after her catheter angiogram he had for peripheral vascular disease. She has noticed some improvement in his memory and cognitive problems but they have not returned back to baseline.  ROS:   14 system review of systems is positive for depression, anxiety, allergies, runny nose, and impotence  PMH:  Past Medical History  Diagnosis Date  . Hypertension   . Depression   . Erectile dysfunction   . GERD (gastroesophageal reflux disease)     Social History:  History   Social History  . Marital Status: Married    Spouse Name: [name]    Number of Children: 4  . Years of Education: HS   Occupational History  . Retired    Social History Main Topics  . Smoking status: Never Smoker   . Smokeless tobacco: Never Used  . Alcohol Use: No  . Drug Use: Not on file  . Sexually Active: Not on file   Other Topics Concern  . Not on file   Social History Narrative   Patient lives at home  spouse.   Caffeine Use: 4-5 cups daily    Medications:   Current Outpatient Prescriptions on File Prior to Visit  Medication Sig Dispense Refill  . Cilostazol (PLETAL PO) Take 25 mg by mouth daily.      . Omega-3 Fatty Acids ([employer] FISH OIL) 1200 MG CAPS Take 1,200 mg by mouth daily.        No current facility-administered medications on file prior to visit.    Allergies:   Allergies  Allergen Reactions  . Other     Aspertone, Artificial Sweetner  . Valium (Diazepam)     Physical Exam General: well developed, well nourished, seated, in no evident distress Head: head normocephalic and atraumatic. Orohparynx benign Neck: supple with no carotid or supraclavicular bruits Cardiovascular: regular rate and rhythm, no murmurs Musculoskeletal: no deformity Skin:  no rash/petichiae Vascular:  Normal pulses all extremities Filed Vitals:   07/22/13 1501  BP: 125/74  Pulse: 94  Temp: 98.6 F (37 C)    Neurologic Exam Mental Status: Awake and fully alert. Oriented to place and time. Recent and remote memory intact. Attention span, concentration and fund of knowledge appropriate. Mood and affect appear depressed. Mini-Mental status score 24/30( not significantly changed from 26/30 last visit) with deficits in attention and recall. Geriatric depression scale he scored 6 history of mild depression. Animal fluency test scored 14 is normal.  Cranial Nerves: Fundoscopic exam reveals sharp disc margins. Pupils equal, briskly  reactive to light. Extraocular movements full without nystagmus. Visual fields full to confrontation. Hearing intact. Facial sensation intact. Face, tongue, palate moves normally and symmetrically.  Motor: Normal bulk and tone. Normal strength in all tested extremity muscles. Sensory.: intact to tough and pinprick and vibratory.  Coordination: Rapid alternating movements normal in all extremities. Finger-to-nose and heel-to-shin performed accurately bilaterally. Gait and  Station: Arises from chair without difficulty. Stance is normal. Gait demonstrates normal stride length and balance . Able to heel, toe and tandem walk without difficulty.  Reflexes: 1+ and symmetric. Toes downgoing.       ASSESSMENT:  79 year old male with memory difficulties due to mild cognitive impairment likely due to underlying untreated depression.   PLAN: I had a long discussion with the patient and his wife regarding the need for optimal treatment for depression. He has stopped his Celexa and is unwilling to try new medications. I will recommend the followup with  [name] his medical doctor for the same. Continue  Cerefolin nac as that seems to have helped the patient. Return for followup in 6 months with Edward Guile, NP

## 2013-07-22 NOTE — Patient Instructions (Addendum)
Have advised the patient and wife to consider trial of alternative antidepressants and see his primary physician Dr. [name] for the same. Continue Cerefolin NAC for his mild cognitive impairment and I gave him some samples. Return for followup in 6 months with Lynn,NP.

## 2013-12-24 ENCOUNTER — Telehealth: Payer: Self-pay | Admitting: Neurology

## 2013-12-24 MED ORDER — CEREFOLIN NAC 6-2-600 MG PO TABS: 1.0000 | ORAL_TABLET | Freq: Every day | ORAL | Status: DC

## 2013-12-24 NOTE — Telephone Encounter (Signed)
Wife calling to request RX for Metafolbic +- states it's a super vitamin patient takes. Pt [facility] an [facility]t [facility] [address]

## 2013-12-24 NOTE — Telephone Encounter (Signed)
There are 2 [facility] on [address].  I called back to verify which one.  Ms Nordling said they use [place].  Rx has been sent.

## 2014-01-22 ENCOUNTER — Ambulatory Visit (INDEPENDENT_AMBULATORY_CARE_PROVIDER_SITE_OTHER): Payer: Medicare Other | Admitting: Nurse Practitioner

## 2014-01-22 ENCOUNTER — Encounter (INDEPENDENT_AMBULATORY_CARE_PROVIDER_SITE_OTHER): Payer: Self-pay

## 2014-01-22 ENCOUNTER — Encounter: Payer: Self-pay | Admitting: Nurse Practitioner

## 2014-01-22 VITALS — BP 143/77 | HR 67 | Wt 158.0 lb

## 2014-01-22 DIAGNOSIS — R413 Other amnesia: Secondary | ICD-10-CM

## 2014-01-22 MED ORDER — METAFOLBIC PLUS 6-2-600 MG PO TABS: 1.0000 | ORAL_TABLET | Freq: Every day | ORAL | Status: DC

## 2014-01-22 NOTE — Progress Notes (Signed)
PATIENT: [name] DOB: [birth year]   REASON FOR VISIT: follow up for memory loss HISTORY FROM: patient  HISTORY OF PRESENT ILLNESS: 80 year old male with memory difficulties due to mild cognitive impairment likely due to underlying untreated depression.   07/22/13 he returns for followup of her last visit with me on 10/04/12. She continues to have memory difficulties which are not significantly worsened. The patient's wife states that she'll notice some improvement after taking the cerefolin nac . He however has stopped his Celexa and is not willing to try other medications for depression. He did have a lab work that I ordered on 10/04/12 and TSH, B12, RPR were normal and homocystine was elevated at 18.4 mol/liter. EEG on 03/29/13 was normal. MRI scan of the brain done on 10/15/12 showed moderate changes of chronic small vessel ischemia and generalized cerebral atrophy. The patient's wife seems fixated on the fact that his symptoms began after his catheter angiogram he had for peripheral vascular disease. She has noticed some improvement in his memory and cognitive problems but they have not returned back to baseline.  Update 01/22/14 (LL):  [name] returns for check up of memory loss.  He is accompanied by his daughter, who states she thinks his memory is stable.  He has no complaints.  He has started taking more vitamins for hair loss, and energy.   ROS:  14 system review of systems is positive only for memory loss.  ALLERGIES: Allergies  Allergen Reactions  . Other     Aspertone, Artificial Sweetner  . Valium [Diazepam]     HOME MEDICATIONS: Outpatient Prescriptions Prior to Visit  Medication Sig Dispense Refill  . clindamycin (CLEOCIN T) 1 % lotion Apply 1 application topically as needed.      . Omega-3 Fatty Acids ([employer] FISH OIL) 1200 MG CAPS Take 1,200 mg by mouth daily.       . Methylfol-Methylcob-Acetylcyst (CEREFOLIN NAC) 6-2-600 MG TABS Take 1 tablet by mouth daily.   90 each  1  . Cilostazol (PLETAL PO) Take 25 mg by mouth daily.       . Biotin 5000 MCG CAPS    Sig: Take 5,000 mcg by mouth daily.  . vitamin A 10000 UNIT capsule    Sig: Take 10,000 Units by mouth daily.  . vitamin C (ASCORBIC ACID) 500 MG tablet    Sig: Take 500 mg by mouth daily.  . Probiotic Product (PROBIOTIC DAILY PO)    Sig: Take by mouth daily.   PHYSICAL EXAM  Filed Vitals:   01/22/14 1527  BP: 143/77  Pulse: 67  Weight: 158 lb (71.668 kg)   Body mass index is 22.05 kg/(m^2).  Generalized: Well developed, in no acute distress, flat affect, sour disposition. Head: normocephalic and atraumatic. Oropharynx benign  Neck: Supple, no carotid bruits  Cardiac: Regular rate rhythm, no murmur  Musculoskeletal: No deformity   Neurological examination  Awake and fully alert. Oriented to place and time. Recent and remote memory intact. Attention span, concentration and fund of knowledge appropriate. Mood and affect appear depressed. Mini-Mental status score 27/30. (Last visit was 24/30, visit before last was 26/30) with deficits in date and recall. Geriatric depression scale he scored 11, history of mild depression. Animal fluency test scored 11 is normal (last visit was 14.)  Cranial Nerves: Fundoscopic exam reveals sharp disc margins. Pupils equal, briskly reactive to light. Extraocular movements full without nystagmus. Visual fields full to confrontation. Hearing intact. Facial sensation intact. Face, tongue, palate  moves normally and symmetrically.  Motor: Normal bulk and tone. Normal strength in all tested extremity muscles.  Sensory.: intact to tough and pinprick and vibratory.  Coordination: Rapid alternating movements normal in all extremities. Finger-to-nose and heel-to-shin performed accurately bilaterally.  Gait and Station: Arises from chair without difficulty. Stance is normal. Gait demonstrates normal stride length and balance . Able to heel, toe and tandem walk without  difficulty.  Reflexes: 1+ and symmetric. Toes downgoing.   ASSESSMENT AND PLAN  80 y.o. year old male  has a past medical history of Hypertension; Depression; Erectile dysfunction; and GERD (gastroesophageal reflux disease) here with for follow up of memory loss.  His MMSE is some better today, scored 27/30.  He continues to take Cefefolin NAC.  He is clearly depressed, (GDS 11) but refuses to try any medication.  PLAN: Continue Cerefolin nac as that seems to have helped the patient. Return for followup in 1 year.  Meds ordered this encounter  Medications  . Methylfol-Methylcob-Acetylcyst (METAFOLBIC PLUS) 6-2-600 MG TABS    Sig: Take 1 tablet by mouth daily.    Dispense:  30 tablet    Refill:  11    Order Specific Question:  Supervising Provider    Answer:  Garvin Fila [[id number]]   Return in about 1 year (around 01/22/2015).  Philmore Pali, MSN, NP-C 01/22/2014, 4:12 PM [facility] [address], [place], [place] [address] [phone]  Note: This document was prepared with digital dictation and possible smart phrase technology. Any transcriptional errors that result from this process are unintentional.

## 2014-01-22 NOTE — Patient Instructions (Signed)
PLAN:  Continue Cerefolin nac as that seems to have helped the patient.   A new prescription was sent. Return for followup in 12 months, sooner as needed.

## 2014-03-12 ENCOUNTER — Telehealth: Payer: Self-pay | Admitting: Interventional Cardiology

## 2014-03-12 NOTE — Telephone Encounter (Signed)
Walk In pt Form " Disability Pla-Card" Dropped Off gave to Amy

## 2014-05-27 ENCOUNTER — Ambulatory Visit: Payer: Medicare Other | Admitting: Interventional Cardiology

## 2014-07-22 ENCOUNTER — Encounter: Payer: Self-pay | Admitting: Cardiology

## 2014-07-25 ENCOUNTER — Ambulatory Visit (INDEPENDENT_AMBULATORY_CARE_PROVIDER_SITE_OTHER): Payer: Medicare Other | Admitting: Interventional Cardiology

## 2014-07-25 ENCOUNTER — Encounter: Payer: Self-pay | Admitting: Interventional Cardiology

## 2014-07-25 VITALS — BP 177/96 | HR 58 | Ht 71.0 in | Wt 157.0 lb

## 2014-07-25 DIAGNOSIS — I10 Essential (primary) hypertension: Secondary | ICD-10-CM

## 2014-07-25 DIAGNOSIS — I739 Peripheral vascular disease, unspecified: Secondary | ICD-10-CM

## 2014-07-25 DIAGNOSIS — E7800 Pure hypercholesterolemia, unspecified: Secondary | ICD-10-CM

## 2014-07-25 MED ORDER — CILOSTAZOL 100 MG PO TABS: 100.0000 mg | ORAL_TABLET | Freq: Every day | ORAL | Status: DC

## 2014-07-25 NOTE — Patient Instructions (Signed)
Your physician recommends that you continue on your current medications as directed. Please refer to the Current Medication list given to you today.  Your physician wants you to follow-up in: 1 year with Dr. Fletcher Anon to establish. You will receive a reminder letter in the mail two months in advance. If you don't receive a letter, please call our office to schedule the follow-up appointment.

## 2014-07-25 NOTE — Progress Notes (Signed)
Patient ID: [name], male   DOB: 1934-04-04, 80 y.o.   MRN: [id number]    [place], [place], [place]  [address] Phone: [phone] Fax:  [phone]  Date:  07/25/2014   ID:  [name], DOB 1933/12/31, MRN [id number]  PCP:  Reginia Naas, MD      History of Present Illness: [name] is a 80 y.o. male who has had PVD. He has had some FTT and had lost weight in 2014. He has been improving from October. He has gained weight since that time. No ulcers or rest pain in his legs. He has had some memory issues as well.   He walks and is able to get around okay. He sometimes has calf pain but oftentimes, he walks through the pain. He does not feel limited by his peripheral vascular disease.   Of note, in the past his wife mentioned that he had memory problems after the angiogram. I again explained that we did not put any catheters near his cerebral circulation. She reported some cognitive decline. She complained about issues that happened in the hospital. He has had some confusion with sedation.    Wt Readings from Last 3 Encounters:  07/25/14 157 lb (71.215 kg)  01/22/14 158 lb (71.668 kg)  07/29/10 169 lb (76.658 kg)     Past Medical History  Diagnosis Date  . Hypertension   . Depression   . Erectile dysfunction   . GERD (gastroesophageal reflux disease)   . PAD (peripheral artery disease)     Current Outpatient Prescriptions  Medication Sig Dispense Refill  . Biotin 5000 MCG CAPS Take 5,000 mcg by mouth daily.      . cilostazol (PLETAL) 100 MG tablet Take 1 tablet (100 mg total) by mouth daily.  90 tablet  3  . clindamycin (CLEOCIN T) 1 % lotion Apply 1 application topically as needed.      . Methylfol-Methylcob-Acetylcyst (METAFOLBIC PLUS) 6-2-600 MG TABS Take 1 tablet by mouth daily.  30 tablet  11  . Omega-3 Fatty Acids ([employer] FISH OIL) 1200 MG CAPS Take 1,200 mg by mouth daily.       . Probiotic Product (PROBIOTIC DAILY PO) Take by  mouth daily.      . vitamin A 10000 UNIT capsule Take 10,000 Units by mouth daily.      . vitamin C (ASCORBIC ACID) 500 MG tablet Take 500 mg by mouth daily.       No current facility-administered medications for this visit.    Allergies:    Allergies  Allergen Reactions  . Other     Aspertone, Artificial Sweetner  . Valium [Diazepam]     Social History:  The patient  reports that he has quit smoking. He has never used smokeless tobacco. He reports that he does not drink alcohol.   Family History:  The patient's family history includes Cancer in his father.   ROS:  Please see the history of present illness.  No nausea, vomiting.  No fevers, chills.  No focal weakness.  No dysuria.    All other systems reviewed and negative.   PHYSICAL EXAM: VS:  BP 177/96  Pulse 58  Ht 5\' 11"  (1.803 m)  Wt 157 lb (71.215 kg)  BMI 21.91 kg/m2 Well nourished, well developed, in no acute distress HEENT: normal Neck: no JVD, no carotid bruits Cardiac:  normal S1, S2; RRR;  Lungs:  clear to auscultation bilaterally, no wheezing, rhonchi or  rales Abd: soft, nontender, no hepatomegaly Ext: no edema, non palpable pulses in bilateral feet Skin: warm and dry Neuro:   no focal abnormalities noted  EKG:  Normal    ASSESSMENT AND PLAN:  Atherosclerosis of leg with intermittent claudication  Refill Pletal Tablet, 50 MG, 1 tablet, Orally, once a day, 90 days, 90, Refills 3 Notes: Would not recommend further workup with repeat angiography and attempted intervention or bypass surgery. At this point, he does not feel that his symptoms warrant such invasive testing. Continue try to walk and improve his symptoms. He is willing to continue the Pletal. He'll let us know if he has any ulcers on his feet that do not heal or any rest pain.  Of note, in the past the wife was very concerned that sedation has had an adverse effect on him. He had minimal sedation at the time of his angiogram. She also reported times  where he received artificial sweetener and Valium. She is concerned that this caused him long term memory impairment. For this reason as well, I would try to avoid any invasive testing. he should followup with his primary care physician regarding his memory issues.    2. Essential hypertension, benign  Notes: In the past, was reluctant to take blood pressure medications . Elevated today. Continue to follow.  He may need some meds.     3. Pure hypercholesterolemia  Notes: He would benefit from a statin due to PVD. He is reluctant to add any more medication at this time.  LDL was 98 at last check in 2013.  He needs a recheck. He will get this with Dr. Tamala Julian.   Preventive Medicine  Adult topics discussed:  Diet: healthy diet.  Exercise: 5 days a week, at least 30 minutes of aerobic exercise.    F/u with Dr. Fletcher Anon in one year.   Signed, Mina Marble, MD, [facility] 07/25/2014 1:27 PM

## 2014-09-09 ENCOUNTER — Telehealth: Payer: Self-pay | Admitting: *Deleted

## 2014-09-09 MED ORDER — CILOSTAZOL 50 MG PO TABS
ORAL_TABLET | ORAL | Status: DC
Start: 2014-09-09 — End: 2015-02-26

## 2014-09-09 NOTE — Telephone Encounter (Signed)
Spoke with pts daughter and let her know pt only needs to take 50 mg of pletal per Dr. Hassell Done last note. Meds updated and rx sent in.

## 2014-09-09 NOTE — Telephone Encounter (Signed)
Patients daughter called stating that cilostazol was refilled for 100mg , but the patient has previously been taking 50mg . He is bleeding more on this dose. She would like a call back when this is clarified. She can be reached at [phone], or call the patients wife. Please advise. Thanks, MI

## 2015-01-22 ENCOUNTER — Ambulatory Visit: Payer: Medicare Other | Admitting: Nurse Practitioner

## 2015-01-26 ENCOUNTER — Telehealth: Payer: Self-pay

## 2015-01-26 NOTE — Telephone Encounter (Signed)
Called to r/s patient appt on 02/04/15 due to a change in provider's schedule. No answer. Left vmail

## 2015-01-29 NOTE — Telephone Encounter (Signed)
Unable to reach patient by phone to r/s appt on 02/04/15. Sent letter.

## 2015-02-03 ENCOUNTER — Encounter: Payer: Self-pay | Admitting: Adult Health

## 2015-02-03 ENCOUNTER — Ambulatory Visit (INDEPENDENT_AMBULATORY_CARE_PROVIDER_SITE_OTHER): Payer: Commercial Managed Care - HMO | Admitting: Adult Health

## 2015-02-03 VITALS — BP 145/85 | HR 77 | Ht 68.0 in | Wt 155.0 lb

## 2015-02-03 DIAGNOSIS — G3184 Mild cognitive impairment, so stated: Secondary | ICD-10-CM

## 2015-02-03 MED ORDER — METAFOLBIC PLUS 6-2-600 MG PO TABS: 1.0000 | ORAL_TABLET | Freq: Every day | ORAL | Status: DC

## 2015-02-03 NOTE — Progress Notes (Signed)
PATIENT: [name] DOB: February 03, 1934  REASON FOR VISIT: follow up- memory loss HISTORY FROM: patient   HISTORY OF PRESENT ILLNESS: [name] is a 81 year old male with a history of mild cognitive impairment. He returns today for follow-up. The patient is currently taking cerefolin nac. He reports that his memory has remained the same. His daughter feels that it has improved. He is able to complete all ADLs independently. His wife will come over and supervise him taking a bath to ensure he does not fall. The patient does not operate a motor vehicle if he does then someone is always with him.  The patient currently lives alone. The patient does have a history of underlying depression and at one point was on Celexa but is currently not amendable to being on any medication. He does report that he feels depressed at times.  No new medical issues since last seen.  History:81 year old male with memory difficulties due to mild cognitive impairment likely due to underlying untreated depression.   07/22/13 he returns for followup of her last visit with me on 10/04/12. She continues to have memory difficulties which are not significantly worsened. The patient's wife states that she'll notice some improvement after taking the cerefolin nac . He however has stopped his Celexa and is not willing to try other medications for depression. He did have a lab work that I ordered on 10/04/12 and TSH, B12, RPR were normal and homocystine was elevated at 18.4 mol/liter. EEG on 03/29/13 was normal. MRI scan of the brain done on 10/15/12 showed moderate changes of chronic small vessel ischemia and generalized cerebral atrophy. The patient's wife seems fixated on the fact that his symptoms began after his catheter angiogram he had for peripheral vascular disease. She has noticed some improvement in his memory and cognitive problems but they have not returned back to baseline.  Update 01/22/14 (LL): [name] returns for  check up of memory loss. He is accompanied by his daughter, who states she thinks his memory is stable. He has no complaints. He has started taking more vitamins for hair loss, and energy.   REVIEW OF SYSTEMS: Out of a complete 14 system review of symptoms, the patient complains only of the following symptoms, and all other reviewed systems are negative.  See HPI  ALLERGIES: Allergies  Allergen Reactions  . Other     Aspertone, Artificial Sweetner  . Valium [Diazepam]     HOME MEDICATIONS: Outpatient Prescriptions Prior to Visit  Medication Sig Dispense Refill  . Biotin 5000 MCG CAPS Take 2,500 mcg by mouth daily.     . cilostazol (PLETAL) 50 MG tablet 1 tablet by mouth daily 90 tablet 3  . clindamycin (CLEOCIN T) 1 % lotion Apply 1 application topically as needed.    . Methylfol-Methylcob-Acetylcyst (METAFOLBIC PLUS) 6-2-600 MG TABS Take 1 tablet by mouth daily. 30 tablet 11  . Omega-3 Fatty Acids ([employer] FISH OIL) 1200 MG CAPS Take 1,000 mg by mouth daily.     . Probiotic Product (PROBIOTIC DAILY PO) Take by mouth daily.    . vitamin A 10000 UNIT capsule Take 10,000 Units by mouth daily.    . vitamin C (ASCORBIC ACID) 500 MG tablet Take 500 mg by mouth daily.     No facility-administered medications prior to visit.    PAST MEDICAL HISTORY: Past Medical History  Diagnosis Date  . Hypertension   . Depression   . Erectile dysfunction   . GERD (gastroesophageal reflux disease)   .  PAD (peripheral artery disease)     PAST SURGICAL HISTORY: History reviewed. No pertinent past surgical history.  FAMILY HISTORY: Family History  Problem Relation Age of Onset  . Cancer Father          PHYSICAL EXAM  Filed Vitals:   02/03/15 1517  BP: 145/85  Pulse: 77  Height: 5\' 8"  (1.727 m)  Weight: 155 lb (70.308 kg)   Body mass index is 23.57 kg/(m^2).  Generalized: Well developed, in no acute distress   Neurological examination  Mentation: Alert oriented to time,  place, history taking. Follows all commands speech and language fluent. MMSE 29/30 Cranial nerve II-XII: Pupils were equal round reactive to light. Extraocular movements were full, visual field were full on confrontational test. Facial sensation and strength were normal. Uvula tongue midline. Head turning and shoulder shrug  were normal and symmetric. Motor: The motor testing reveals 5 over 5 strength of all 4 extremities. Good symmetric motor tone is noted throughout.  Sensory: Sensory testing is intact to soft touch on all 4 extremities. No evidence of extinction is noted.  Coordination: Cerebellar testing reveals good finger-nose-finger and heel-to-shin bilaterally.  Gait and station: Gait is normal. Tandem gait is unsteady. Romberg is negative. No drift is seen.  Reflexes: Deep tendon reflexes are symmetric and normal bilaterally.    DIAGNOSTIC DATA (LABS, IMAGING, TESTING) - I reviewed patient records, labs, notes, testing and imaging myself where available.   ASSESSMENT AND PLAN 81 y.o. year old male  has a past medical history of Hypertension; Depression; Erectile dysfunction; GERD (gastroesophageal reflux disease); and PAD (peripheral artery disease). here with:  1. Memory loss  Patient memory has remained stable. Today MMSE 29/30 Continue Cerefolin nac. Refill sent.  F/U in 1 year or sooner if needed.    Ward Givens, MSN, NP-C 02/03/2015, 3:26 PM [facility] [address], [place], [place] [address] [phone]  Note: This document was prepared with digital dictation and possible smart phrase technology. Any transcriptional errors that result from this process are unintentional.

## 2015-02-03 NOTE — Progress Notes (Signed)
I agree with the assessment and plan as directed by NP .The patient is known to me .   Vicenta Olds, MD  

## 2015-02-03 NOTE — Patient Instructions (Addendum)
Continue taking the metafolbic. Memory is stable.

## 2015-02-04 ENCOUNTER — Ambulatory Visit: Payer: Medicare Other | Admitting: Nurse Practitioner

## 2015-02-06 NOTE — Progress Notes (Signed)
I agree with the above plan 

## 2015-02-26 ENCOUNTER — Other Ambulatory Visit: Payer: Self-pay

## 2015-02-26 MED ORDER — CILOSTAZOL 50 MG PO TABS: ORAL_TABLET | ORAL | Status: DC

## 2015-04-21 ENCOUNTER — Encounter: Payer: Self-pay | Admitting: Cardiovascular Disease

## 2015-04-21 ENCOUNTER — Ambulatory Visit (INDEPENDENT_AMBULATORY_CARE_PROVIDER_SITE_OTHER): Payer: Commercial Managed Care - HMO | Admitting: Cardiovascular Disease

## 2015-04-21 VITALS — BP 140/80 | HR 71 | Ht 71.0 in | Wt 152.2 lb

## 2015-04-21 DIAGNOSIS — I739 Peripheral vascular disease, unspecified: Secondary | ICD-10-CM

## 2015-04-21 DIAGNOSIS — T148 Other injury of unspecified body region: Secondary | ICD-10-CM

## 2015-04-21 DIAGNOSIS — R0989 Other specified symptoms and signs involving the circulatory and respiratory systems: Secondary | ICD-10-CM | POA: Diagnosis not present

## 2015-04-21 DIAGNOSIS — T148XXA Other injury of unspecified body region, initial encounter: Secondary | ICD-10-CM

## 2015-04-21 LAB — CBC WITH DIFFERENTIAL/PLATELET
Basophils Absolute: 0.1 10*3/uL (ref 0.0–0.1)
Basophils Relative: 0.7 % (ref 0.0–3.0)
EOS PCT: 2.9 % (ref 0.0–5.0)
Eosinophils Absolute: 0.3 10*3/uL (ref 0.0–0.7)
HCT: 46 % (ref 39.0–52.0)
Hemoglobin: 15.5 g/dL (ref 13.0–17.0)
Lymphocytes Relative: 16.6 % (ref 12.0–46.0)
Lymphs Abs: 1.7 10*3/uL (ref 0.7–4.0)
MCHC: 33.7 g/dL (ref 30.0–36.0)
MCV: 86.7 fl (ref 78.0–100.0)
MONO ABS: 0.4 10*3/uL (ref 0.1–1.0)
Monocytes Relative: 4 % (ref 3.0–12.0)
NEUTROS PCT: 75.8 % (ref 43.0–77.0)
Neutro Abs: 8 10*3/uL — ABNORMAL HIGH (ref 1.4–7.7)
Platelets: 484 10*3/uL — ABNORMAL HIGH (ref 150.0–400.0)
RBC: 5.31 Mil/uL (ref 4.22–5.81)
RDW: 16.1 % — ABNORMAL HIGH (ref 11.5–15.5)
WBC: 10.5 10*3/uL (ref 4.0–10.5)

## 2015-04-21 LAB — BASIC METABOLIC PANEL
BUN: 19 mg/dL (ref 6–23)
CHLORIDE: 107 meq/L (ref 96–112)
CO2: 26 mEq/L (ref 19–32)
Calcium: 9.4 mg/dL (ref 8.4–10.5)
Creatinine, Ser: 1.31 mg/dL (ref 0.40–1.50)
GFR: 56.19 mL/min — ABNORMAL LOW (ref >60.00)
Glucose, Bld: 104 mg/dL — ABNORMAL HIGH (ref 70–99)
Potassium: 3.7 mEq/L (ref 3.5–5.1)
Sodium: 140 mEq/L (ref 135–145)

## 2015-04-21 MED ORDER — CILOSTAZOL 50 MG PO TABS: ORAL_TABLET | ORAL | Status: DC

## 2015-04-21 NOTE — Assessment & Plan Note (Signed)
The patient cannot tell me if he has claudication or not. He seems to be stable overall and thus I recommend continuing medical therapy. He is on cilostazol long-term.

## 2015-04-21 NOTE — Progress Notes (Signed)
HPI  This is a 81 year old man who is here to establish care regarding peripheral arterial disease. He used to follow-up with Dr.Varanasi.   Previous angiogram in 2012 showed moderate bilateral external iliac artery stenosis, long right SFA occlusion with three-vessel runoff below the knee, short left SFA occlusion with three-vessel runoff below the knee. He has been treated medically. Of note, in the past his wife mentioned that he had memory problems after the angiogram. The patient seems to have gradual cognitive decline. He is not able to provide me with much details about his symptoms. He is not sure if he has claudication or not. No lower extremity ulceration. He is complaining of easy bruising on the left arm.   Allergies  Allergen Reactions  . Other     Aspertone, Artificial Sweetner, makes crazy per pt  . Valium [Diazepam]     Makes crazy per pt Also causes amnesia, per pt      Current Outpatient Prescriptions on File Prior to Visit  Medication Sig Dispense Refill  . Biotin 5000 MCG CAPS Take 2,500 mcg by mouth daily.     . Methylfol-Methylcob-Acetylcyst (METAFOLBIC PLUS) 6-2-600 MG TABS Take 1 tablet by mouth daily. 30 tablet 11  . Omega-3 Fatty Acids ([employer] FISH OIL) 1200 MG CAPS Take 1,000 mg by mouth once a week.     . Probiotic Product (PROBIOTIC DAILY PO) Take 1 tablet by mouth daily.     . vitamin A 10000 UNIT capsule Take 10,000 Units by mouth daily.    . vitamin C (ASCORBIC ACID) 500 MG tablet Take 500 mg by mouth daily.     No current facility-administered medications on file prior to visit.     Past Medical History  Diagnosis Date  . Hypertension   . Depression   . Erectile dysfunction   . GERD (gastroesophageal reflux disease)   . PAD (peripheral artery disease)      History reviewed. No pertinent past surgical history.   Family History  Problem Relation Age of Onset  . Cancer Father      History   Social History  . Marital Status: Married      Spouse Name: Diane  . Number of Children: 4  . Years of Education: HS   Occupational History  . Retired    Social History Main Topics  . Smoking status: Former Research scientist (life sciences)  . Smokeless tobacco: Never Used  . Alcohol Use: No  . Drug Use: Not on file  . Sexual Activity: Not on file   Other Topics Concern  . Not on file   Social History Narrative   Patient lives at home spouse.   Caffeine Use: 4-5 cups daily     PHYSICAL EXAM   BP 140/80 mmHg  Pulse 71  Ht 5\' 11"  (1.803 m)  Wt 152 lb 3.2 oz (69.037 kg)  BMI 21.24 kg/m2 Constitutional: He is oriented to person, place, and time. He appears well-developed and well-nourished. No distress.  HENT: No nasal discharge.  Head: Normocephalic and atraumatic.  Eyes: Pupils are equal and round.  No discharge. Neck: Normal range of motion. Neck supple. No JVD present. No thyromegaly present. Left carotid bruit. Cardiovascular: Normal rate, regular rhythm, normal heart sounds. Exam reveals no gallop and no friction rub. No murmur heard.  Pulmonary/Chest: Effort normal and breath sounds normal. No stridor. No respiratory distress. He has no wheezes. He has no rales. He exhibits no tenderness.  Abdominal: Soft. Bowel sounds are normal. He exhibits no  distension. There is no tenderness. There is no rebound and no guarding.  Musculoskeletal: Normal range of motion. He exhibits no edema and no tenderness.  Neurological: He is alert and oriented to person, place, and time. Coordination normal.  Skin: Skin is warm and dry. No rash noted. He is not diaphoretic. No erythema. No pallor.  Psychiatric: He has a normal mood and affect. His behavior is normal. Judgment and thought content normal.         ASSESSMENT AND PLAN

## 2015-04-21 NOTE — Assessment & Plan Note (Signed)
I requested carotid Doppler. 

## 2015-04-21 NOTE — Assessment & Plan Note (Signed)
This could be due to treatment with cilostazol. However, we should rule out reversible causes and thus I requested CBC and basic metabolic profile.

## 2015-04-21 NOTE — Patient Instructions (Signed)
Medication Instructions:  Your physician recommends that you continue on your current medications as directed. Please refer to the Current Medication list given to you today.  Labwork: Your physician recommends that you have lab work today: BMP and CBC  Testing/Procedures: Your physician has requested that you have a carotid duplex. This test is an ultrasound of the carotid arteries in your neck. It looks at blood flow through these arteries that supply the brain with blood. Allow one hour for this exam. There are no restrictions or special instructions.  Follow-Up: Your physician wants you to follow-up in: 1 YEAR with Dr Fletcher Anon.  You will receive a reminder letter in the mail two months in advance. If you don't receive a letter, please call our office to schedule the follow-up appointment.   Any Other Special Instructions Will Be Listed Below (If Applicable).

## 2015-04-28 ENCOUNTER — Ambulatory Visit (HOSPITAL_COMMUNITY): Payer: Commercial Managed Care - HMO | Attending: Cardiovascular Disease

## 2015-04-28 DIAGNOSIS — I739 Peripheral vascular disease, unspecified: Secondary | ICD-10-CM | POA: Insufficient documentation

## 2015-04-28 DIAGNOSIS — R0989 Other specified symptoms and signs involving the circulatory and respiratory systems: Secondary | ICD-10-CM | POA: Diagnosis not present

## 2015-04-28 DIAGNOSIS — I6523 Occlusion and stenosis of bilateral carotid arteries: Secondary | ICD-10-CM | POA: Insufficient documentation

## 2015-05-05 ENCOUNTER — Other Ambulatory Visit: Payer: Self-pay

## 2015-05-05 DIAGNOSIS — I6522 Occlusion and stenosis of left carotid artery: Secondary | ICD-10-CM

## 2015-05-08 ENCOUNTER — Encounter: Payer: Self-pay | Admitting: Vascular Surgery

## 2015-05-11 ENCOUNTER — Other Ambulatory Visit: Payer: Self-pay | Admitting: *Deleted

## 2015-05-11 DIAGNOSIS — I6522 Occlusion and stenosis of left carotid artery: Secondary | ICD-10-CM

## 2015-05-12 ENCOUNTER — Encounter: Payer: Self-pay | Admitting: Vascular Surgery

## 2015-05-12 ENCOUNTER — Ambulatory Visit (INDEPENDENT_AMBULATORY_CARE_PROVIDER_SITE_OTHER): Payer: Commercial Managed Care - HMO | Admitting: Vascular Surgery

## 2015-05-12 ENCOUNTER — Encounter (HOSPITAL_COMMUNITY): Payer: Commercial Managed Care - HMO

## 2015-05-12 VITALS — BP 157/76 | HR 82 | Temp 98.0°F | Resp 16 | Ht 71.0 in | Wt 153.6 lb

## 2015-05-12 DIAGNOSIS — I6522 Occlusion and stenosis of left carotid artery: Secondary | ICD-10-CM

## 2015-05-12 NOTE — Progress Notes (Signed)
Subjective:     Patient ID: [name], male   DOB: 04/06/1934, 81 y.o.   MRN: [id number]  HPI this 81 year old male was referred by Dr. Annia Belt for evaluation of severe left internal carotid stenosis. Patient has no history of stroke, lateralizing weakness, aphasia, amaurosis fugax, diplopia, blurred vision, or syncope. Patient does have some mild dementia. Left carotid bruit was heard in a carotid duplex exam performed on 04/28/2015 reveals 90% left ICA stenosis with minimal right ICA stenosis. Patient has a history of PAD with occlusive disease in the lower extremities but no treatment has been performed and his claudication symptoms are stable. Patient takes Pletal but no aspirin or Plavix or other blood thinners. He has no history of coronary artery disease previous myocardial infarction.  Past Medical History  Diagnosis Date  . Hypertension   . Depression   . Erectile dysfunction   . GERD (gastroesophageal reflux disease)   . PAD (peripheral artery disease)     History  Substance Use Topics  . Smoking status: Former Research scientist (life sciences)  . Smokeless tobacco: Never Used  . Alcohol Use: No    Family History  Problem Relation Age of Onset  . Cancer Father     Allergies  Allergen Reactions  . Other     Aspertone, Artificial Sweetner, makes crazy per pt  . Valium [Diazepam]     Makes crazy per pt Also causes amnesia, per pt      Current outpatient prescriptions:  .  Biotin 5000 MCG CAPS, Take 2,500 mcg by mouth daily. , Disp: , Rfl:  .  cilostazol (PLETAL) 50 MG tablet, 1 tablet by mouth daily, Disp: 90 tablet, Rfl: 3 .  Methylfol-Methylcob-Acetylcyst (METAFOLBIC PLUS) 6-2-600 MG TABS, Take 1 tablet by mouth daily., Disp: 30 tablet, Rfl: 11 .  Omega-3 Fatty Acids ([employer] FISH OIL) 1200 MG CAPS, Take 1,000 mg by mouth once a week. , Disp: , Rfl:  .  Probiotic Product (PROBIOTIC DAILY PO), Take 1 tablet by mouth daily. , Disp: , Rfl:  .  vitamin A 10000 UNIT capsule, Take 10,000  Units by mouth daily., Disp: , Rfl:  .  vitamin C (ASCORBIC ACID) 500 MG tablet, Take 500 mg by mouth daily., Disp: , Rfl:   Filed Vitals:   05/12/15 1458 05/12/15 1504  BP: 179/74 157/76  Pulse: 78 82  Temp: 98 F (36.7 C)   TempSrc: Oral   Resp: 16   Height: 5\' 11"  (1.803 m)   Weight: 153 lb 9.6 oz (69.673 kg)   SpO2: 99%     Body mass index is 21.43 kg/(m^2).           Review of Systems denies chest pain, dyspnea on exertion. Does have bilateral leg discomfort with ambulation at 1 block. Has mild chronic dementia. Other systems negative and complete review of systems     Objective:   Physical Exam BP 157/76 mmHg  Pulse 82  Temp(Src) 98 F (36.7 C) (Oral)  Resp 16  Ht 5\' 11"  (1.803 m)  Wt 153 lb 9.6 oz (69.673 kg)  BMI 21.43 kg/m2  SpO2 99%  Gen.-alert and oriented x3 in no apparent distress HEENT normal for age Lungs no rhonchi or wheezing Cardiovascular regular rhythm no murmurs carotid pulses 3+ palpable-high-pitched left carotid bruit Abdomen soft nontender no palpable masses Musculoskeletal free of  major deformities Skin clear -no rashes Neurologic normal Lower extremities 2-3 plus femoral pulses bilaterally palpable. No distal pulses palpable. Both feet adequately perfused with  1+ edema.  Today I reviewed the carotid duplex report performed at the [facility] vascular lab. Left ICA appears to have a 90% stenosis with peak systolic velocity of 518 cm/s and end-diastolic velocity of 841 senna meters per second. Right ICA appears to have no stenosis.       Assessment:     Severe-90% left ICA stenosis-asymptomatic and patient with mild dementia No history of coronary artery disease PAD with bilateral lower extremity claudication     Plan:     Spent 40 minutes discussing situation with patient has daughter and significant other and discussing risks benefits and working on coordination of care for this gentleman Have recommended left  carotid endarterectomy Patient is not ready to make a decision yet but will be back in touch with [place] when he decides what he would like to do and we will schedule that in a timely fashion Wrist and benefits thoroughly discussed

## 2015-06-15 ENCOUNTER — Other Ambulatory Visit: Payer: Self-pay

## 2015-07-27 ENCOUNTER — Telehealth: Payer: Self-pay

## 2015-07-27 NOTE — Telephone Encounter (Signed)
Follow-up phone call to pt. regarding scheduling Left CEA.  LMTCB regarding scheduling office appt. with Dr. Kellie Simmering to discuss surgery.

## 2016-01-26 ENCOUNTER — Ambulatory Visit (INDEPENDENT_AMBULATORY_CARE_PROVIDER_SITE_OTHER): Payer: Commercial Managed Care - HMO | Admitting: Cardiovascular Disease

## 2016-01-26 ENCOUNTER — Encounter: Payer: Self-pay | Admitting: Cardiovascular Disease

## 2016-01-26 VITALS — BP 168/100 | HR 78 | Ht 71.0 in | Wt 158.0 lb

## 2016-01-26 DIAGNOSIS — I6522 Occlusion and stenosis of left carotid artery: Secondary | ICD-10-CM | POA: Insufficient documentation

## 2016-01-26 DIAGNOSIS — I739 Peripheral vascular disease, unspecified: Secondary | ICD-10-CM | POA: Diagnosis not present

## 2016-01-26 DIAGNOSIS — R42 Dizziness and giddiness: Secondary | ICD-10-CM | POA: Diagnosis not present

## 2016-01-26 DIAGNOSIS — E7800 Pure hypercholesterolemia, unspecified: Secondary | ICD-10-CM | POA: Diagnosis not present

## 2016-01-26 NOTE — Assessment & Plan Note (Addendum)
The patient has severe asymptomatic left carotid stenosis. He has declined surgery before but he is going to follow-up with Dr. Kellie Simmering. I asked him to start taking aspirin 81 mg once daily.

## 2016-01-26 NOTE — Assessment & Plan Note (Signed)
I don't have his previous labs but given his carotid disease and peripheral arterial disease, there is an indication for treatment with a statin.

## 2016-01-26 NOTE — Assessment & Plan Note (Signed)
Cilostazol was stopped. He denies any claudication.

## 2016-01-26 NOTE — Patient Instructions (Signed)

## 2016-01-26 NOTE — Assessment & Plan Note (Signed)
The patient seems to be having recurrent episodes of dizziness of unclear etiology. Unfortunately, given his dementia, it's difficult to obtain accurate history. He does seem to have some premature beats and it is not entirely clear if he is having some arrhythmic events. I suggested proceeding with a 48-hour Holter monitor, an echocardiogram and repeating his carotid Doppler. However, I was informed by his wife that these tests will not be covered by his insurance unless they are ordered by his primary care physician. I am going to forward this note to Dr. Tamala Julian.

## 2016-01-26 NOTE — Progress Notes (Signed)
HPI  This is a 82 year old man who is here today for a follow-up visit regarding  peripheral arterial disease. Previous angiogram in 2012 showed moderate bilateral external iliac artery stenosis, long right SFA occlusion with three-vessel runoff below the knee, short left SFA occlusion with three-vessel runoff below the knee. He has been treated medically. The patient has at least moderate dementia. During last visit, he was noted to have left carotid bruit. Carotid Doppler showed high-grade left carotid stenosis. I referred him to Dr. Kellie Simmering who recommended proceeding with carotid endarterectomy. However, the patient and family could not decide. The patient himself cannot really provide me much details about his history due to his memory problems. He is having episodes of dizziness according to his family with no loss of consciousness. He loses strength in his legs but he doesn't know what exactly happens. He denies chest pain or shortness of breath. From his description it sounds like he is getting episodes of lightheadedness.   Allergies  Allergen Reactions  . Other     Aspertone, Artificial Sweetner, makes crazy per pt  . Valium [Diazepam]     Makes crazy per pt Also causes amnesia, per pt      Current Outpatient Prescriptions on File Prior to Visit  Medication Sig Dispense Refill  . Biotin 5000 MCG CAPS Take 2,500 mcg by mouth daily.     . Methylfol-Methylcob-Acetylcyst (METAFOLBIC PLUS) 6-2-600 MG TABS Take 1 tablet by mouth daily. 30 tablet 11  . Omega-3 Fatty Acids ([employer] FISH OIL) 1200 MG CAPS Take 1,000 mg by mouth once a week.     . Probiotic Product (PROBIOTIC DAILY PO) Take 1 tablet by mouth daily.     . vitamin A 10000 UNIT capsule Take 10,000 Units by mouth daily.    . vitamin C (ASCORBIC ACID) 500 MG tablet Take 500 mg by mouth daily.     No current facility-administered medications on file prior to visit.     Past Medical History  Diagnosis Date  . Hypertension   .  Depression   . Erectile dysfunction   . GERD (gastroesophageal reflux disease)   . PAD (peripheral artery disease) (HCC)      No past surgical history on file.   Family History  Problem Relation Age of Onset  . Cancer Father      Social History   Social History  . Marital Status: Married    Spouse Name: Diane  . Number of Children: 4  . Years of Education: HS   Occupational History  . Retired    Social History Main Topics  . Smoking status: Former Research scientist (life sciences)  . Smokeless tobacco: Never Used  . Alcohol Use: No  . Drug Use: Not on file  . Sexual Activity: Not on file   Other Topics Concern  . Not on file   Social History Narrative   Patient lives at home spouse.   Caffeine Use: 4-5 cups daily     PHYSICAL EXAM   BP 168/100 mmHg  Pulse 78  Ht 5\' 11"  (1.803 m)  Wt 158 lb (71.668 kg)  BMI 22.05 kg/m2 Constitutional: He is oriented to person, place, and time. He appears well-developed and well-nourished. No distress.  HENT: No nasal discharge.  Head: Normocephalic and atraumatic.  Eyes: Pupils are equal and round.  No discharge. Neck: Normal range of motion. Neck supple. No JVD present. No thyromegaly present. Left carotid bruit. Cardiovascular: Normal rate, regular rhythm, normal heart sounds. Exam reveals no gallop  and no friction rub. No murmur heard.  Pulmonary/Chest: Effort normal and breath sounds normal. No stridor. No respiratory distress. He has no wheezes. He has no rales. He exhibits no tenderness.  Abdominal: Soft. Bowel sounds are normal. He exhibits no distension. There is no tenderness. There is no rebound and no guarding.  Musculoskeletal: Normal range of motion. He exhibits no edema and no tenderness.  Neurological: He is alert and oriented to person, place, and time. Coordination normal.  Skin: Skin is warm and dry. No rash noted. He is not diaphoretic. No erythema. No pallor.  Psychiatric: He has a normal mood and affect. His behavior is normal.  Judgment and thought content normal.     EKG: Normal sinus rhythm with PACs.    ASSESSMENT AND PLAN

## 2016-01-27 ENCOUNTER — Telehealth: Payer: Self-pay | Admitting: Cardiovascular Disease

## 2016-01-27 NOTE — Telephone Encounter (Signed)
I spoke with the pt's wife and made her aware that Dr Fletcher Anon did send the pt's 01/26/16 office visit note to Dr Tamala Julian on 01/26/16.  I have refaxed his office note through [employer] today.

## 2016-01-27 NOTE — Telephone Encounter (Signed)
New messages    Patient was seen in the office on yesterday Dr. Tamala Julian has not receive the order for heart monitor ultrasound carotid artery.

## 2016-02-03 ENCOUNTER — Telehealth: Payer: Self-pay | Admitting: Cardiovascular Disease

## 2016-02-03 NOTE — Telephone Encounter (Signed)
New message      Calling to let the nurse know to schedule the test that was discussed recently.  [employer] said to use reference number [id number].

## 2016-02-03 NOTE — Telephone Encounter (Signed)
Routed to [employer]

## 2016-02-04 NOTE — Telephone Encounter (Signed)
I will have to discuss this pt with the billing department as I am unsure what the reference number has to do with ordering 48 hour holter monitor, echo and carotid.

## 2016-02-09 ENCOUNTER — Telehealth: Payer: Self-pay | Admitting: Cardiovascular Disease

## 2016-02-09 DIAGNOSIS — R42 Dizziness and giddiness: Secondary | ICD-10-CM

## 2016-02-09 NOTE — Telephone Encounter (Signed)
I spoke with the pt's daughter and made her aware that we have sent a copy of the pt's office note to Dr Tamala Julian a number of times.  I read Dr Tyrell Antonio note to the pt's daughter that referenced the pt needing an echo, carotid and 48 hour holter monitor.  [name] would like our office to go ahead and place the orders for these test and get them scheduled. Per [name] the pt's wife has spoken with [facility] and they gave her a reference number for our billing department to use in regards to getting these test covered by our office.    [employer] said to use reference number [id number].  Orders have been placed in Epic.  I will have a scheduler contact the pt's wife to arrange appointments. I will forward this message to billing so that they can determine pre-certification for these tests.

## 2016-02-09 NOTE — Telephone Encounter (Signed)
Please see follow-up telephone call from 02/09/16.

## 2016-02-09 NOTE — Telephone Encounter (Signed)
New message      Pt was seen 2 weeks ago.  There is some confusion regarding patient's follow up.  Did Dr Fletcher Anon want pt to have a heart monitor and carotid artery ultrasound?  If yes, order needs to come from PCP so that ins will pay.  No one has called to schedule appt.  Did we send order to PCP?   Please call to let daughter if test are still needed

## 2016-02-10 NOTE — Telephone Encounter (Signed)
Once they are scheduled we will make sure they are prior approved before he has them done.  He has a referral to see [place], so we will just need to input the authorization request once it is scheduled.

## 2016-02-16 ENCOUNTER — Ambulatory Visit (INDEPENDENT_AMBULATORY_CARE_PROVIDER_SITE_OTHER): Payer: Commercial Managed Care - HMO | Admitting: Neurology

## 2016-02-16 ENCOUNTER — Encounter: Payer: Self-pay | Admitting: Neurology

## 2016-02-16 VITALS — BP 153/79 | HR 68 | Ht 71.0 in | Wt 160.2 lb

## 2016-02-16 DIAGNOSIS — G3184 Mild cognitive impairment, so stated: Secondary | ICD-10-CM | POA: Diagnosis not present

## 2016-02-16 MED ORDER — METAFOLBIC PLUS 6-2-600 MG PO TABS
1.0000 | ORAL_TABLET | Freq: Every day | ORAL | Status: DC
Start: 2016-02-16 — End: 2016-12-01

## 2016-02-16 NOTE — Patient Instructions (Signed)
I had a long discussion with the patient and his daughter regarding his mild cognitive impairment which appears to be stable. He was encouraged to continue Cerefolin NAC and given a prescription for a year. I also recommended he try taking Resevetarol 200 mg daily. I advised him to increase participation in cognitively challenging activities like solving crossword puzzles, sudoku and playing bridge. We also discussed the role of Aricept-like medications but patient and daughter are reluctant to try this at the current time. They will return for follow-up in a year or call earlier if he has cognitive worsening sooner   Memory Compensation Strategies  1. Use "WARM" strategy.  W= write it down  A= associate it  R= repeat it  M= make a mental note  2.   You can keep a Social worker.  Use a 3-ring notebook with sections for the following: calendar, important names and phone numbers,  medications, doctors' names/phone numbers, lists/reminders, and a section to journal what you did  each day.   3.    Use a calendar to write appointments down.  4.    Write yourself a schedule for the day.  This can be placed on the calendar or in a separate section of the Memory Notebook.  Keeping a  regular schedule can help memory.  5.    Use medication organizer with sections for each day or morning/evening pills.  You may need help loading it  6.    Keep a basket, or pegboard by the door.  Place items that you need to take out with you in the basket or on the pegboard.  You may also want to  include a message board for reminders.  7.    Use sticky notes.  Place sticky notes with reminders in a place where the task is performed.  For example: " turn off the  stove" placed by the stove, "lock the door" placed on the door at eye level, " take your medications" on  the bathroom mirror or by the place where you normally take your medications.  8.    Use alarms/timers.  Use while cooking to remind yourself to  check on food or as a reminder to take your medicine, or as a  reminder to make a call, or as a reminder to perform another task, etc. 200 mg daily to help with cognitive impairment and keep himself busy with cognitively challenging activities like solving crossword puzzles, sudoku. I discussed medications like Aricept with patient and daughter are reluctant to try them at the present time but they will call me if he has further cognitive decline. I also discussed memory compensation strategies. He will return otherwise for follow-up in a year

## 2016-02-16 NOTE — Progress Notes (Signed)
PATIENT: [name] DOB: Oct 23, 1934  REASON FOR VISIT: follow up- memory loss HISTORY FROM: patient   HISTORY OF PRESENT ILLNESS: [name] is a 82 year old male with a history of mild cognitive impairment. He returns today for follow-up. The patient is currently taking cerefolin nac. He reports that his memory has remained the same. His daughter feels that it has improved. He is able to complete all ADLs independently. His wife will come over and supervise him taking a bath to ensure he does not fall. The patient does not operate a motor vehicle if he does then someone is always with him.  The patient currently lives alone. The patient does have a history of underlying depression and at one point was on Celexa but is currently not amendable to being on any medication. He does report that he feels depressed at times.  No new medical issues since last seen.  History:82 year old male with memory difficulties due to mild cognitive impairment likely due to underlying untreated depression.   07/22/13 he returns for followup of her last visit with me on 10/04/12. She continues to have memory difficulties which are not significantly worsened. The patient's wife states that she'll notice some improvement after taking the cerefolin nac . He however has stopped his Celexa and is not willing to try other medications for depression. He did have a lab work that I ordered on 10/04/12 and TSH, B12, RPR were normal and homocystine was elevated at 18.4 mol/liter. EEG on 03/29/13 was normal. MRI scan of the brain done on 10/15/12 showed moderate changes of chronic small vessel ischemia and generalized cerebral atrophy. The patient's wife seems fixated on the fact that his symptoms began after his catheter angiogram he had for peripheral vascular disease. She has noticed some improvement in his memory and cognitive problems but they have not returned back to baseline.  Update 01/22/14 (LL): [name] returns for  check up of memory loss. He is accompanied by his daughter, who states she thinks his memory is stable. He has no complaints. He has started taking more vitamins for hair loss, and energy.  Update 02/16/2016 : He returns for follow-up accompanied by his daughter [name] after last visit 1 year ago. He feels his doing about the same and is short-term memory difficulties and not getting any worse. He is still independent and does all activities for himself except he give up driving several years ago. Patient has not had any episodes or history of seizures, TIA or strokes. He stopped taking fish oil daily as it upset his stomach and takes it only once a week. He had a couple of episodes which her daughter witnessed and he skin became warm and he was feeling uneasy though he was quite awake alert and interactive during this episode. It is unclear wether he had eaten or not and his pulse and blood pressure not recorded. This lasted a few minutes and resolved. REVIEW OF SYSTEMS: Out of a complete 14 system review of symptoms, the patient complains only of the following symptoms, and all other reviewed systems are negative. Dizziness, lightheadedness, memory loss See HPI  ALLERGIES: Allergies  Allergen Reactions  . Other     Aspertone, Artificial Sweetner, makes crazy per pt  . Valium [Diazepam]     Makes crazy per pt Also causes amnesia, per pt     HOME MEDICATIONS: Outpatient Prescriptions Prior to Visit  Medication Sig Dispense Refill  . Biotin 5000 MCG CAPS Take 2,500 mcg by  mouth daily.     . Omega-3 Fatty Acids ([employer] FISH OIL) 1200 MG CAPS Take 1,000 mg by mouth once a week.     . Probiotic Product (PROBIOTIC DAILY PO) Take 1 tablet by mouth daily.     . vitamin A 10000 UNIT capsule Take 10,000 Units by mouth daily.    . vitamin C (ASCORBIC ACID) 500 MG tablet Take 500 mg by mouth daily.    . Methylfol-Methylcob-Acetylcyst (METAFOLBIC PLUS) 6-2-600 MG TABS Take 1 tablet by mouth daily. 30  tablet 11   No facility-administered medications prior to visit.    PAST MEDICAL HISTORY: Past Medical History  Diagnosis Date  . Depression   . Erectile dysfunction   . GERD (gastroesophageal reflux disease)   . PAD (peripheral artery disease) ([place])     PAST SURGICAL HISTORY: History reviewed. No pertinent past surgical history.  FAMILY HISTORY: Family History  Problem Relation Age of Onset  . Cancer Father          PHYSICAL EXAM  Filed Vitals:   02/16/16 1513  BP: 153/79  Pulse: 68  Height: 5\' 11"  (1.803 m)  Weight: 160 lb 3.2 oz (72.666 kg)   Body mass index is 22.35 kg/(m^2).  Generalized: Well developed Elderly pleasant Caucasian male, in no acute distress   Neurological examination  Mentation: Alert oriented to time, place, history taking. Follows all commands speech and language fluent. MMSE 24/30 With all 5 deficits in calculations only. Clock drawing 4/4. Animal naming 11. Cranial nerve II-XII: Pupils were equal round reactive to light. Extraocular movements were full, visual field were full on confrontational test. Facial sensation and strength were normal. Uvula tongue midline. Head turning and shoulder shrug  were normal and symmetric. Motor: The motor testing reveals 5 over 5 strength of all 4 extremities. Good symmetric motor tone is noted throughout.  Sensory: Sensory testing is intact to soft touch on all 4 extremities. No evidence of extinction is noted.  Coordination: Cerebellar testing reveals good finger-nose-finger and heel-to-shin bilaterally.  Gait and station: Gait is normal. Tandem gait is unsteady. Romberg is negative. No drift is seen.  Reflexes: Deep tendon reflexes are symmetric and normal bilaterally.    DIAGNOSTIC DATA (LABS, IMAGING, TESTING) - I reviewed patient records, labs, notes, testing and imaging myself where available.   ASSESSMENT AND PLAN 82 y.o. year old male  has a past medical history of Depression; Erectile  dysfunction; GERD (gastroesophageal reflux disease); and PAD (peripheral artery disease) ([place]). here with:  1. Memory loss  I had a long discussion with the patient and his daughter regarding his mild cognitive impairment which appears to be stable. He was encouraged to continue Cerefolin NAC and given a prescription for a year. I also recommended he try taking Resevetarol 200 mg daily. I advised him to increase participation in cognitively challenging activities like solving crossword puzzles, sudoku and playing bridge. We also discussed the role of Aricept-like medications but patient and daughter are reluctant to try this at the current time. They will return for follow-up in a year or call earlier if he has cognitive worsening sooner   Antony Contras, MD  02/16/2016, 3:56 PM [name] [address], [place], [place] [address] [phone]  Note: This document was prepared with digital dictation and possible smart phrase technology. Any transcriptional errors that result from this process are unintentional.

## 2016-02-17 ENCOUNTER — Ambulatory Visit (INDEPENDENT_AMBULATORY_CARE_PROVIDER_SITE_OTHER): Payer: Commercial Managed Care - HMO

## 2016-02-17 DIAGNOSIS — R42 Dizziness and giddiness: Secondary | ICD-10-CM | POA: Diagnosis not present

## 2016-02-17 NOTE — Telephone Encounter (Signed)
Pt's appointments have been scheduled in Epic.

## 2016-02-22 ENCOUNTER — Telehealth: Payer: Self-pay | Admitting: Cardiovascular Disease

## 2016-02-22 ENCOUNTER — Other Ambulatory Visit (HOSPITAL_COMMUNITY): Payer: Self-pay

## 2016-02-22 NOTE — Telephone Encounter (Signed)
She returned his monitor on Friday afternoon. She wants to know if he used it correctly?

## 2016-02-22 NOTE — Telephone Encounter (Signed)
Spoke with daughter and she states that Friday morning pt recorded an event on his monitor diary but did not press the button. She wasn't sure if this mattered. Advised wife that we receive strips from monitor company and pt is called if anything critical shows. Daughter asked about getting results for the monitor. Spoke with Shelly in monitors and she said images are not in system yet. Advised daughter of this and that when they are downloaded they will be sent to doctor for him to review and then a nurse will call with those results. Daughter verbalized understanding and was appreciative for call back.

## 2016-02-23 ENCOUNTER — Ambulatory Visit (HOSPITAL_COMMUNITY)
Admission: RE | Admit: 2016-02-23 | Discharge: 2016-02-23 | Disposition: A | Discharge status: Home / Self Care | Payer: Commercial Managed Care - HMO | Source: Ambulatory Visit | Attending: Cardiovascular Disease | Admitting: Cardiovascular Disease

## 2016-02-23 DIAGNOSIS — I6523 Occlusion and stenosis of bilateral carotid arteries: Secondary | ICD-10-CM | POA: Insufficient documentation

## 2016-02-23 DIAGNOSIS — R42 Dizziness and giddiness: Secondary | ICD-10-CM | POA: Diagnosis present

## 2016-02-23 DIAGNOSIS — I6501 Occlusion and stenosis of right vertebral artery: Secondary | ICD-10-CM | POA: Diagnosis not present

## 2016-03-02 ENCOUNTER — Encounter: Payer: Self-pay | Admitting: Vascular Surgery

## 2016-03-02 ENCOUNTER — Telehealth: Payer: Self-pay | Admitting: Cardiovascular Disease

## 2016-03-02 ENCOUNTER — Other Ambulatory Visit (HOSPITAL_COMMUNITY): Payer: Self-pay

## 2016-03-02 NOTE — Telephone Encounter (Signed)
I spoke with the pt's daughter and made her aware of carotid and holter monitor results.  She said that she has to discuss the results with the pt and his wife and determine if the pt would like to start Metoprolol Tartrate.  [name] will contact the office if they would like a Rx sent to the pharmacy. I also provided [name] with the help desk for My Chart.

## 2016-03-02 NOTE — Telephone Encounter (Signed)
New Message:   Pt wore heart monitor a few weeks ago,she would like the results.Pt is locked out of my-chart.

## 2016-03-03 ENCOUNTER — Telehealth: Payer: Self-pay | Admitting: Cardiovascular Disease

## 2016-03-03 NOTE — Telephone Encounter (Signed)
Route to refill pool

## 2016-03-03 NOTE — Telephone Encounter (Signed)
New message     OK to call in new medication to [facility] mail order pharmacy.

## 2016-03-08 ENCOUNTER — Ambulatory Visit (INDEPENDENT_AMBULATORY_CARE_PROVIDER_SITE_OTHER): Payer: Commercial Managed Care - HMO | Admitting: Vascular Surgery

## 2016-03-08 ENCOUNTER — Encounter: Payer: Self-pay | Admitting: Vascular Surgery

## 2016-03-08 VITALS — BP 183/92 | HR 76 | Temp 96.6°F | Resp 16 | Ht 71.0 in | Wt 163.0 lb

## 2016-03-08 DIAGNOSIS — I6522 Occlusion and stenosis of left carotid artery: Secondary | ICD-10-CM | POA: Diagnosis not present

## 2016-03-08 DIAGNOSIS — I6529 Occlusion and stenosis of unspecified carotid artery: Secondary | ICD-10-CM | POA: Insufficient documentation

## 2016-03-08 NOTE — Progress Notes (Signed)
Subjective:     Patient ID: [name], male   DOB: 02/26/1934, 82 y.o.   MRN: [id number]  HPI  This 82 year old male returns to the office today with his wife and daughter to discuss left carotid endarterectomy. Patient was seen by me in May 2016 at which time he had a severe left ICA stenosis which was probably asymptomatic. He does have some dizziness and some dementia problems and occasionally will develop weakness in both lower extremities according to his wife but has never had clear-cut left brain TIAs or left brain CVA. He has been recently seen by Dr. Leonie Man  And Dr.Arrida  Who referred him back to me because a repeat carotid ultrasound performed 02/23/2016 reveals a more severe left internal carotid stenosis than last year. Velocities are now 522/192 in the ICA which correlates with an approximate 95% ICA stenosis.   Past Medical History  Diagnosis Date  . Depression   . Erectile dysfunction   . GERD (gastroesophageal reflux disease)   . PAD (peripheral artery disease) ([place])     Social History  Substance Use Topics  . Smoking status: Former Research scientist (life sciences)  . Smokeless tobacco: Never Used  . Alcohol Use: No    Family History  Problem Relation Age of Onset  . Cancer Father     Allergies  Allergen Reactions  . Other     Aspertone, Artificial Sweetner, makes crazy per pt  . Valium [Diazepam]     Makes crazy per pt Also causes amnesia, per pt      Current outpatient prescriptions:  .  Biotin 5000 MCG CAPS, Take 2,500 mcg by mouth daily. , Disp: , Rfl:  .  Methylfol-Methylcob-Acetylcyst (METAFOLBIC PLUS) 6-2-600 MG TABS, Take 1 tablet by mouth daily., Disp: 30 tablet, Rfl: 11 .  Omega-3 Fatty Acids ([employer] FISH OIL) 1200 MG CAPS, Take 1,000 mg by mouth once a week. , Disp: , Rfl:  .  Probiotic Product (PROBIOTIC DAILY PO), Take 1 tablet by mouth daily. , Disp: , Rfl:  .  vitamin A 10000 UNIT capsule, Take 10,000 Units by mouth daily., Disp: , Rfl:  .  vitamin C (ASCORBIC ACID)  500 MG tablet, Take 500 mg by mouth daily., Disp: , Rfl:  .  vitamin C (ASCORBIC ACID) 500 MG tablet, Use as directed 500 mg in the mouth or throat daily. Reported on 03/08/2016, Disp: , Rfl:   Filed Vitals:   03/08/16 1609 03/08/16 1612 03/08/16 1613  BP: 170/93 180/93 183/92  Pulse: 73 76 76  Temp: 96.6 F (35.9 C)    Resp: 16    Height: 5\' 11"  (1.803 m)    Weight: 163 lb (73.936 kg)    SpO2: 97%      Body mass index is 22.74 kg/(m^2).          Review of Systems Patient has memory problems. Has history of GERD. Denies active chest pain, dyspnea on exertion, PND, orthopnea, hemoptysis. Most questions are answered by his daughter or wife. His affect is flat. Other systems negativeand complete eview of ystems     Objective:   Physical Exam BP 183/92 mmHg  Pulse 76  Temp(Src) 96.6 F (35.9 C)  Resp 16  Ht 5\' 11"  (1.803 m)  Wt 163 lb (73.936 kg)  BMI 22.74 kg/m2  SpO2 97%  Gen.-alert and oriented x3 in no apparent distress HEENT normal for age Lungs no rhonchi or wheezing Cardiovascular regular rhythm no murmurs carotid pulses 3+ palpable -harsh left carotid bruit  Abdomen soft nontender no palpable masses Musculoskeletal free of  major deformities Skin clear -no rashes Neurologic normal except flat affect and decreased memory Lower extremities 3+ femoral and dorsalis pedis pulses palpable bilaterally with no edema   today I reviewed the carotid duplex exam from March 2020 17 and compared it with the carotid duplex exam from May 2016. It appears that the left ICA stenosis as a proximally 95% with a mild right ICA stenosis      Assessment:      #195% left ICA stenosis-likely asymptomatic possibly causing some dizziness  #2 poor memory - possible dementia   #3 GERD #4 PAD with previous stents placed by [place]:      I once again discussed the situation at length with patient's wife daughter and the patient explaining that this severe stenosis has  gotten worse and is likely asymptomatic. This could result in a stroke or no symptoms or TIAs.  I have recommended left carotid endarterectomy and discussed the risks involved and once again they would like to consider this. They would like to have a preoperative consultation with anesthesiology because they state that his memory has never been the same since IV sedation prior to a stent procedure on his legs    they will notify our office if they would like to proceed with left carotid endarterectomy and Colletta Maryland will facilitate a preoperative consultation with anesthesiology to discuss these issues. We will wait to see what family decides

## 2016-03-08 NOTE — Progress Notes (Signed)
Filed Vitals:   03/08/16 1609 03/08/16 1612 03/08/16 1613  BP: 170/93 180/93 183/92  Pulse: 73 76 76  Temp: 96.6 F (35.9 C)    Resp: 16    Height: 5\' 11"  (1.803 m)    Weight: 163 lb (73.936 kg)    SpO2: 97%

## 2016-03-09 ENCOUNTER — Other Ambulatory Visit: Payer: Self-pay

## 2016-03-10 NOTE — Progress Notes (Addendum)
Anesthesia PAT Evaluation: Patient is a 82 year old male scheduled for left carotid endarterectomy on 03/23/16 by Dr. Kellie Simmering. Patient has had known asymptomatic severe LICA stenosis since at least 04/2015, but was not ready to consent for surgery until this year.  Patient's wife has requested an anesthesia consultation to discuss concerns about his memory, which she feels started after he underwent sedation for a peripheral a-gram procedure (see below).  PMHX: includes PAD with bilateral SFA occlusions '12, carotid artery stenosis, former smoker, GERD, ED, depression, hypercholesterolemia, memory loss, dysrhythmia (PACs, PVCs), appendectomy '60's.   Anesthesia Concerns: Patient underwent and abdominal aortogram, pelvic angiogram, and BLE angiogram by Dr. Irish Lack on 08/04/11. He was premedicated with 1 mg Versed IV and 25 mcg Fentanyl IV. No intervention was performed. On 08/22/11, he was admitted for dehydration with poor intake and worsening depression. Notes indicate that one of his daughters had died several months prior. He had been started on Pristiq. He was hydrated and discharged home on 08/23/11. He was readmitted on 09/11/11 for confusion. He was given Ativan in the ED for mild agitation. Wife says he also received Valium. He was going in and out of mental clarity. 09/12/11 Head CT showed no acute intracranial pathology with mild cortical volume loss and scattered small vessel ischemic microangiopathy, small chronic lacunar infarcts in the basal ganglia bilaterally, and partial opacification of the left masiotd cells. Ammonia, WBC, LFTs, TSH were WNL. Altered mental status thought possibly related to mild dementia with underlying cerebrovascular disease. In-patient work-up was not completed as "patient's spouse took him away AMA" on 09/12/11. Readmitted again on 10/09/11 with dehydration and failure to thrive. He refused to eat and to participate in PT/OT. No concern for infection found. Psychiatry consult  ordered but patient and spouse would not wait to be seen, so he was discharged home on 10/12/11. He did see neurologist Dr. Leonie Man on 10/04/12 and still has on-going care. By notes, an EEG in 2014 was normal, and MRI scan of the brain on 10/15/12 "moderate changes of chronic back first to ischemia and generalized cerebral atrophy. At some point Pristiq was stopped. He tried Celexa, but patient also stopped it and was unwilling to try other medications for depression. Dr. Leonie Man felt his mild cognitive impairment may be due to his underlying untreated depression. However, his note also states, "The patient's wife seems fixated on the fact that his symptoms began after her catheter angiogram he had for peripheral vascular disease." Dr. Irish Lack noted patient had some confusion with sedation but did not put any catheters near his cerebral circulation. According to the patient's wife and daughter [name] [phone]), after the angiogram he had over six months of cognitive dysfunction including hallucinations, agitation, paranoia. He became uncooperative, didn't want to eat, and had to be cared for like a child. It was well over six months before his family started to notice improvement. Although he is more like himself now, he still has issues with short term memory that were not present before his angiogram. His wife said that she has noticed a consistent pattern of cognitive dysfunction after receiving sedative-type and muscle-relaxant type medications (specifically, she is concerned about Versed and Valium; he also has hallucinations and nightmares with aspartame). She thinks these medications have effected him more so than the fentanyl.  PATIENT AND FAMILY DO NOT WANT HIM PRE-MEDICATED BEFORE SURGERY AND REQUEST NO BENZODIAZEPINE MEDICATIONS.  PCP is Dr. Carol Ada. Cardiologist is now Dr. Kathlyn Sacramento, last visit 01/26/16. Patient was seen  for episodes of dizziness, so Dr. Fletcher Anon suggested a 48 hour Holter,  carotid duplex and echocardiogram. Echo is currently scheduled for 03/22/16 at 1 PM. Patient reports last hot and dizzy episode occurred about 3 weeks ago. Neurologist is Dr. Antony Contras, last visit 02/16/16.  Meds include Biotin, Metafolbic Plus (L-methylfolate calcium 6mg /methylcobalamin 2mg /N-acetyl-L-cysteine 600mg ), fish oil, Probiotic, vitamin C and A. Metoprolol recently ordered for PACs, PVCs, but is still on mail order. He and his family are still deciding if it is a medication they want him to take on a regular basis.   PAT Vitals: BP 165/97, HR 85, RR 20, T 36.6C, O2 sat 96%. On exam, he is a pleasant Caucasian male in NAD. He is cooperative but does rely on his wife and daughter to fill in many details regarding his history. Heart RRR, no murmur noted. Lungs clear. Mild BLE pre-tibial edema (< 1+).   01/26/16 EKG: SR with PACs.  02/2016 48 hour Holter monitor: Predominant SR. PVCs, some bigeminal, ventricular couplets, some SVEs come paired and brief runs. Symptom entry showed SR without ectopy. (Dr. Fletcher Anon reviewed. Patient reported dizziness. Frequent PVCs and PACs could be contributing, so he recommendrf metoprolol tartrate 25 mg BID. Patient and wife would let him know if he was willing to start.   02/23/16 Carotid U/S: Impressions: Heterogeneous plaque, bilaterally. 40-59% RICA stenosis, increased from prior exam. Stable 123456 LICA stenosis. Normal SCA, bilaterally. Chronically occluded right vertebral artery.   Preoperative labs noted. Cr 1.43, up from 1.31 on 04/21/15. Glucose 90. H/H 15.4/47.7. PLT 454K. PT/INR WNL. PTT 39. He could not provide a urine specimen today, so will plan to get on the day of surgery.    Prior to patient's PAT visit, I had reviewed his anesthesia history with anesthesiologist Dr. Ermalene Postin. Dr. Kellie Simmering did confirm that patient would need GETA for this procedure. While at his PAT visit, anesthesiologist Dr. Oletta Lamas also spoke with patient and his family. She advised  that typically benzodiazepines are not given prior to carotid surgery, but would ask that BZDs be voided at their request. They were told that he would likely receive fentanyl for pain perioperatively from anesthesia. Although we will attempt everything in our power to minimize his risk of post-operative cognitive dysfunction, they were told that total avoidance could not be guaranteed and in fact he is likely higher risk due to his age, underlying memory deficits, and prior history. They felt questions were answered and were encouraged to discuss his anesthesia plan further when he meets with his assigned anesthesiologist on the day of surgery.   In regards to his pending echo, they were encouraged to contact Dr. Tyrell Antonio office (and speak with Charlena Cross) to see if date could be moved up. If it can't they want to forego the echo until after surgery due to time constraints. I advised that we would want to clarify timing of his echo with Dr. Fletcher Anon, so to please keep echo scheduled unless Dr. Fletcher Anon does not feel it is needed prior to surgery. Family wants this clarified. Dr. Fletcher Anon is out of the office until Monday, so I will follow-up with him at the [place] office at that time.  George Hugh [facility] Stay [facility]/Anesthesiology Phone [phone] 03/16/2016 6:32 PM  Addendum: Christell Faith, PA-C with Dr. Fletcher Anon attempted to get patient's echo moved up. Apparently, there was only an earlier opening at their [place] office, and the family did not want to drive the further distance so have kept his current  appointment on 03/22/16. In the meantime, Dr. Fletcher Anon did reply to my staff message and wrote: "He does not need the echo for the sake of surgery. It was ordered to evaluate his dizziness. I am not expecting anything major. OK to proceed with surgery first from my standpoint." However, in discussion with anesthesiologist Dr. Veatrice Kells, our preference would still be for patient to have his echocardiogram done  preoperatively. Patient with known CAD risk factors including HLD, dysrhythmia, PAD, carotid stenosis, advanced age and echo findings could provide helpful information that could assist his anesthesia team in providing the safest anesthesia. If they choose to still cancel the echo, then it is possible that his anesthesiologist may still feel comfortable proceeding since Dr. Fletcher Anon felt it was not required prior to surgery; however, if there were any concerning findings on evaluation on the morning of surgery then it is also possible his anesthesiologist would want surgery delayed until the further testing can be performed. Patient and family had requested that I contact his daughter [name] with an update. I have spoke with [name] and reviewed recommendations as discussed with Dr. Jillyn Hidden including the potential that his surgery could be canceled if the echo was not done and his anesthesiologist had any concerns about exam findings or history. She will discuss this with the patient and his wife Diane. We also discussed asking [facility] to place him on a cancellation list so if an echo slot became available before 03/22/16 that they could be offered the sooner appointment instead. I have spoken with Charlena Cross with echo scheduling about this and also asked that if his echo is done on 03/22/16 that it be made a priority read.   George Hugh [facility] Short Stay Center/Anesthesiology Phone [phone] 03/18/2016 2:29 PM  Addendum: Patient arrived for his 1PM echo today. Results showed: Study Conclusions - Left ventricle: The cavity size was normal. There was mild focal basal hypertrophy of the septum. Systolic function was normal. The estimated ejection fraction was in the range of 60% to 65%. Wall motion was normal; there were no regional wall motion abnormalities. Doppler parameters are consistent with abnormal left ventricular relaxation (grade 1 diastolic dysfunction). - Aortic valve: Trileaflet; mildly  thickened, mildly calcified leaflets. There was trivial regurgitation. - Mitral valve: Calcified annulus. Calcification.  Results reviewed by Dr. Fletcher Anon. He felt patient was low risk for surgery from a cardiac standpoint.  George Hugh [facility] Short Stay Center/Anesthesiology Phone [phone] 03/22/2016 5:41 PM

## 2016-03-16 ENCOUNTER — Encounter (HOSPITAL_COMMUNITY): Payer: Self-pay

## 2016-03-16 ENCOUNTER — Encounter (HOSPITAL_COMMUNITY)
Admission: RE | Admit: 2016-03-16 | Discharge: 2016-03-16 | Disposition: A | Discharge status: Home / Self Care | Payer: Commercial Managed Care - HMO | Source: Ambulatory Visit | Attending: Vascular Surgery | Admitting: Vascular Surgery

## 2016-03-16 DIAGNOSIS — I6522 Occlusion and stenosis of left carotid artery: Secondary | ICD-10-CM | POA: Insufficient documentation

## 2016-03-16 DIAGNOSIS — Z01818 Encounter for other preprocedural examination: Secondary | ICD-10-CM | POA: Insufficient documentation

## 2016-03-16 DIAGNOSIS — Z01812 Encounter for preprocedural laboratory examination: Secondary | ICD-10-CM | POA: Diagnosis not present

## 2016-03-16 DIAGNOSIS — K219 Gastro-esophageal reflux disease without esophagitis: Secondary | ICD-10-CM | POA: Insufficient documentation

## 2016-03-16 DIAGNOSIS — Z79899 Other long term (current) drug therapy: Secondary | ICD-10-CM | POA: Insufficient documentation

## 2016-03-16 DIAGNOSIS — Z87891 Personal history of nicotine dependence: Secondary | ICD-10-CM | POA: Insufficient documentation

## 2016-03-16 DIAGNOSIS — E7800 Pure hypercholesterolemia, unspecified: Secondary | ICD-10-CM | POA: Diagnosis not present

## 2016-03-16 DIAGNOSIS — Z0183 Encounter for blood typing: Secondary | ICD-10-CM | POA: Diagnosis not present

## 2016-03-16 DIAGNOSIS — R413 Other amnesia: Secondary | ICD-10-CM | POA: Diagnosis not present

## 2016-03-16 HISTORY — DX: Other complications of anesthesia, initial encounter: T88.59XA

## 2016-03-16 HISTORY — DX: Adverse effect of unspecified anesthetic, initial encounter: T41.45XA

## 2016-03-16 HISTORY — DX: Cardiac arrhythmia, unspecified: I49.9

## 2016-03-16 LAB — CBC
HCT: 47.7 % (ref 39.0–52.0)
HEMOGLOBIN: 15.4 g/dL (ref 13.0–17.0)
MCH: 29.1 pg (ref 26.0–34.0)
MCHC: 32.3 g/dL (ref 30.0–36.0)
MCV: 90 fL (ref 78.0–100.0)
PLATELETS: 454 10*3/uL — AB (ref 150–400)
RBC: 5.3 MIL/uL (ref 4.22–5.81)
RDW: 15.5 % (ref 11.5–15.5)
WBC: 8.8 10*3/uL (ref 4.0–10.5)

## 2016-03-16 LAB — PREPARE RBC (CROSSMATCH)

## 2016-03-16 LAB — PROTIME-INR
INR: 1.15 (ref 0.00–1.49)
Prothrombin Time: 14.9 seconds (ref 11.6–15.2)

## 2016-03-16 LAB — COMPREHENSIVE METABOLIC PANEL
ALBUMIN: 3.8 g/dL (ref 3.5–5.0)
ALK PHOS: 77 U/L (ref 38–126)
ALT: 15 U/L — ABNORMAL LOW (ref 17–63)
ANION GAP: 12 (ref 5–15)
AST: 26 U/L (ref 15–41)
BUN: 18 mg/dL (ref 6–20)
CALCIUM: 9.1 mg/dL (ref 8.9–10.3)
CHLORIDE: 108 mmol/L (ref 101–111)
CO2: 21 mmol/L — AB (ref 22–32)
Creatinine, Ser: 1.43 mg/dL — ABNORMAL HIGH (ref 0.61–1.24)
GFR calc non Af Amer: 45 mL/min — ABNORMAL LOW (ref >60)
GFR, EST AFRICAN AMERICAN: 52 mL/min — AB (ref >60)
GLUCOSE: 90 mg/dL (ref 65–99)
Potassium: 4 mmol/L (ref 3.5–5.1)
SODIUM: 141 mmol/L (ref 135–145)
Total Bilirubin: 0.7 mg/dL (ref 0.3–1.2)
Total Protein: 7.3 g/dL (ref 6.5–8.1)

## 2016-03-16 LAB — ABO/RH: ABO/RH(D): B POS

## 2016-03-16 LAB — SURGICAL PCR SCREEN
MRSA, PCR: NEGATIVE
Staphylococcus aureus: NEGATIVE

## 2016-03-16 LAB — APTT: APTT: 39 s — AB (ref 24–37)

## 2016-03-16 NOTE — Anesthesia Preprocedure Evaluation (Addendum)
Anesthesia Evaluation  Patient identified by MRN, date of birth, ID band Patient awake    Reviewed: Allergy & Precautions, NPO status , Patient's Chart, lab work & pertinent test results  History of Anesthesia Complications (+) Emergence Delirium and history of anesthetic complications  Airway Mallampati: II  TM Distance: >3 FB Neck ROM: full    Dental no notable dental hx. (+) Dental Advidsory Given, Edentulous Upper, Upper Dentures   Pulmonary neg pulmonary ROS, former smoker,    Pulmonary exam normal breath sounds clear to auscultation       Cardiovascular hypertension, On Medications + Peripheral Vascular Disease  Normal cardiovascular exam Rhythm:regular Rate:Normal     Neuro/Psych PSYCHIATRIC DISORDERS negative neurological ROS  negative psych ROS   GI/Hepatic negative GI ROS, Neg liver ROS, GERD  Medicated and Controlled,  Endo/Other  negative endocrine ROS  Renal/GU negative Renal ROS  negative genitourinary   Musculoskeletal negative musculoskeletal ROS (+)   Abdominal   Peds negative pediatric ROS (+)  Hematology negative hematology ROS (+)   Anesthesia Other Findings   Reproductive/Obstetrics negative OB ROS                          Anesthesia Physical Anesthesia Plan  ASA: III  Anesthesia Plan: General   Post-op Pain Management:    Induction: Intravenous  Airway Management Planned: Oral ETT  Additional Equipment: Arterial line  Intra-op Plan:   Post-operative Plan: Extubation in OR  Informed Consent: I have reviewed the patients History and Physical, chart, labs and discussed the procedure including the risks, benefits and alternatives for the proposed anesthesia with the patient or authorized representative who has indicated his/her understanding and acceptance.   Dental Advisory Given  Plan Discussed with: Anesthesiologist, CRNA and Surgeon  Anesthesia Plan  Comments: (See my anesthesia note. Requests no pre-med/NO BENZODIAZEPINES. Myra Gianotti, PA-C)      Anesthesia Quick Evaluation

## 2016-03-16 NOTE — Progress Notes (Signed)
Call to [place], when pt. interview completed with pt. & family. Asked that the anesth. Consult will be done. Pt.'s family reports that Rx for Metoprolol is being waited on from mail order, instructed to take day of surgery if he has started a couple days prior to surgery & knows that his reaction to it was acceptable.  They report that the PCP gave him a medicine for an irreg. Heart beat several yrs. Ago & it had to be d/'c'd because his pulse fell so low. Same info. Given to A. Zelenak,PA-C as well.

## 2016-03-16 NOTE — Pre-Procedure Instructions (Signed)
RIOTT FULLER  03/16/2016      [facility] - [place], [place] - [place] [address] Phone: [phone] Fax: [phone]  [facility] [address] - [place], [place] - [place] [place] [address] Phone: [phone] Fax: [phone]    Your procedure is scheduled on 03/23/2016.  Report to [facility] Admitting at 6:30 A.M.  Call this number if you have problems the morning of surgery:  [phone]   Remember:  Do not eat food or drink liquids after midnight.  On Tuesday   Take these medicines the morning of surgery with A SIP OF WATER : METOPROLOL   Do not wear jewelry   Do not wear lotions, powders, or perfumes.  You may wear deodorant.              Men may shave face and neck.   Do not bring valuables to the hospital.   [facility] is not responsible for any belongings or valuables.  Contacts, dentures or bridgework may not be worn into surgery.  Leave your suitcase in the car.  After surgery it may be brought to your room.  For patients admitted to the hospital, discharge time will be determined by your treatment team.  Patients discharged the day of surgery will not be allowed to drive home.   Name and phone number of your driver:   With family  Special instructions:  Special Instructions: [facility] - Preparing for Surgery  Before surgery, you can play an important role.  Because skin is not sterile, your skin needs to be as free of germs as possible.  You can reduce the number of germs on you skin by washing with CHG (chlorahexidine gluconate) soap before surgery.  CHG is an antiseptic cleaner which kills germs and bonds with the skin to continue killing germs even after washing.  Please DO NOT use if you have an allergy to CHG or antibacterial soaps.  If your skin becomes reddened/irritated stop using the CHG and inform your nurse when you  arrive at Short Stay.  Do not shave (including legs and underarms) for at least 48 hours prior to the first CHG shower.  You may shave your face.  Please follow these instructions carefully:   1.  Shower with CHG Soap the night before surgery and the  morning of Surgery.  2.  If you choose to wash your hair, wash your hair first as usual with your  normal shampoo.  3.  After you shampoo, rinse your hair and body thoroughly to remove the  Shampoo.  4.  Use CHG as you would any other liquid soap.  You can apply chg directly to the skin and wash gently with scrungie or a clean washcloth.  5.  Apply the CHG Soap to your body ONLY FROM THE NECK DOWN.    Do not use on open wounds or open sores.  Avoid contact with your eyes, ears, mouth and genitals (private parts).  Wash genitals (private parts)   with your normal soap.  6.  Wash thoroughly, paying special attention to the area where your surgery will be performed.  7.  Thoroughly rinse your body with warm water from the neck down.  8.  DO NOT shower/wash with your normal soap after using and rinsing off   the CHG Soap.  9.  Pat yourself dry with a clean towel.  10.  Wear clean pajamas.            11.  Place clean sheets on your bed the night of your first shower and do not sleep with pets.  Day of Surgery  Do not apply any lotions/deodorants the morning of surgery.  Please wear clean clothes to the hospital/surgery center.  Please read over the following fact sheets that you were given. Pain Booklet, Coughing and Deep Breathing, Blood Transfusion Information, MRSA Information and Surgical Site Infection Prevention

## 2016-03-17 NOTE — Telephone Encounter (Signed)
Follow up     Pt is scheduled to have an echo on 03-22-16.  He is having carotid artery surgery on 03-23-16.  Can pt resc echo until after surgery?  He has already been cleared but the anesthesiologist saw in the computer that he is scheduled for an echo and thinks he needs this prior to surgery.  Please call

## 2016-03-17 NOTE — Telephone Encounter (Signed)
S/w pt wife, Shauna Hugh, who states pt is napping at this time.  Reviewed need for echo prior to carotid surgery April 5 with wife (on [place]). Wife states they live in [place] and would prefer to have echo at [facility] Echo originally scheduled there April 4, 1pm. Wife does not want pt to have two appointments on consecutive days and asks to have echo appt moved up. I contacted [employer]. Per scheduling, they have no sooner openings. S/w wife and offered appt in [facility] office March 31 instead. She is appreciative of the efforts but states they will keep April 4 echo appt in [place]. Wife verbalized understanding with no further questions.

## 2016-03-17 NOTE — Telephone Encounter (Signed)
Is there anyway to get this echo done 3/31 or 4/3 in the office or with Sonia Side in the hospital prior to his carotid surgery to assess his LV systolic function? Thanks.

## 2016-03-17 NOTE — Telephone Encounter (Signed)
Inquiry sent to Dr. Fletcher Anon.

## 2016-03-22 ENCOUNTER — Other Ambulatory Visit: Payer: Self-pay

## 2016-03-22 ENCOUNTER — Ambulatory Visit (HOSPITAL_BASED_OUTPATIENT_CLINIC_OR_DEPARTMENT_OTHER): Payer: Commercial Managed Care - HMO

## 2016-03-22 ENCOUNTER — Telehealth: Payer: Self-pay | Admitting: Cardiovascular Disease

## 2016-03-22 DIAGNOSIS — Z87891 Personal history of nicotine dependence: Secondary | ICD-10-CM | POA: Insufficient documentation

## 2016-03-22 DIAGNOSIS — R42 Dizziness and giddiness: Secondary | ICD-10-CM | POA: Diagnosis not present

## 2016-03-22 DIAGNOSIS — I351 Nonrheumatic aortic (valve) insufficiency: Secondary | ICD-10-CM

## 2016-03-22 DIAGNOSIS — E785 Hyperlipidemia, unspecified: Secondary | ICD-10-CM | POA: Insufficient documentation

## 2016-03-22 DIAGNOSIS — I059 Rheumatic mitral valve disease, unspecified: Secondary | ICD-10-CM

## 2016-03-22 DIAGNOSIS — I119 Hypertensive heart disease without heart failure: Secondary | ICD-10-CM

## 2016-03-22 MED ORDER — SODIUM CHLORIDE 0.9 % IV SOLN: INTRAVENOUS | Status: DC

## 2016-03-22 MED ORDER — DEXTROSE 5 % IV SOLN
1.5000 g | INTRAVENOUS | Status: AC
  Administered 2016-03-23: 1.5 g via INTRAVENOUS
  Filled 2016-03-22: qty 1.5

## 2016-03-22 MED ORDER — METOPROLOL TARTRATE 25 MG PO TABS: 25.0000 mg | ORAL_TABLET | Freq: Two times a day (BID) | ORAL | Status: DC

## 2016-03-22 NOTE — Telephone Encounter (Signed)
Request for surgical clearance:  1. What type of surgery is being performed? Left Carotid   When is this surgery scheduled? Tomorrow-03-23-16 -Need this asap 2. Are there any medications that need to be held prior to surgery and how long?General Cardiac Clearance-Waiting for Echo results   3. Name of physician performing surgery? Dr Tinnie Gens   4. What is your office phone and fax number? [phone]-Fax#-[phone]  5.

## 2016-03-22 NOTE — Telephone Encounter (Signed)
Echocardiogram performed today.  The pt did not start Metoprolol Tartrate as recommended by Dr Fletcher Anon based on monitor results.  The daughter requested that Rx be sent to mail order pharmacy today while the pt was having Echo.  I will forward this STAT request to Dr Fletcher Anon.

## 2016-03-22 NOTE — Telephone Encounter (Signed)
Per documentation from Dr Fletcher Anon on the pt's Echo:  Notes Recorded by Wellington Hampshire, MD on 03/22/2016 at 5:00 PM Inform patient that echo was fine. Normal EF. He is at low risk from a cardiac standpoint. No need to hold anything.  I made Dr Fletcher Anon aware that pt has not started beta blocker at this point and he said to hold off on starting it at this time since the pt is having surgery tomorrow.  The pt does not have this medication as it is coming from a mail order pharmacy. I spoke with the pt's daughter and made her aware of echo results and that the pt is cleared from a cardiac stand point for surgery tomorrow.

## 2016-03-23 ENCOUNTER — Inpatient Hospital Stay (HOSPITAL_COMMUNITY): Payer: Commercial Managed Care - HMO | Admitting: Vascular Surgery

## 2016-03-23 ENCOUNTER — Inpatient Hospital Stay (HOSPITAL_COMMUNITY)
Admission: RE | Admit: 2016-03-23 | Discharge: 2016-03-24 | DRG: 039 | Disposition: A | Discharge status: Home / Self Care | Payer: Commercial Managed Care - HMO | Source: Ambulatory Visit | Attending: Vascular Surgery | Admitting: Vascular Surgery

## 2016-03-23 ENCOUNTER — Inpatient Hospital Stay (HOSPITAL_COMMUNITY): Payer: Commercial Managed Care - HMO | Admitting: Certified Registered Nurse Anesthetist

## 2016-03-23 ENCOUNTER — Encounter (HOSPITAL_COMMUNITY): Payer: Self-pay | Admitting: *Deleted

## 2016-03-23 ENCOUNTER — Encounter (HOSPITAL_COMMUNITY)
Admission: RE | Disposition: A | Discharge status: Home / Self Care | Payer: Self-pay | Source: Ambulatory Visit | Attending: Vascular Surgery

## 2016-03-23 DIAGNOSIS — R451 Restlessness and agitation: Secondary | ICD-10-CM | POA: Diagnosis present

## 2016-03-23 DIAGNOSIS — Z87891 Personal history of nicotine dependence: Secondary | ICD-10-CM

## 2016-03-23 DIAGNOSIS — K219 Gastro-esophageal reflux disease without esophagitis: Secondary | ICD-10-CM | POA: Diagnosis present

## 2016-03-23 DIAGNOSIS — I6522 Occlusion and stenosis of left carotid artery: Secondary | ICD-10-CM

## 2016-03-23 DIAGNOSIS — Z888 Allergy status to other drugs, medicaments and biological substances status: Secondary | ICD-10-CM | POA: Diagnosis not present

## 2016-03-23 DIAGNOSIS — Z91018 Allergy to other foods: Secondary | ICD-10-CM

## 2016-03-23 DIAGNOSIS — I6529 Occlusion and stenosis of unspecified carotid artery: Secondary | ICD-10-CM | POA: Diagnosis present

## 2016-03-23 DIAGNOSIS — Z79899 Other long term (current) drug therapy: Secondary | ICD-10-CM | POA: Diagnosis not present

## 2016-03-23 DIAGNOSIS — R1110 Vomiting, unspecified: Secondary | ICD-10-CM | POA: Diagnosis not present

## 2016-03-23 DIAGNOSIS — Z781 Physical restraint status: Secondary | ICD-10-CM | POA: Diagnosis not present

## 2016-03-23 DIAGNOSIS — I6523 Occlusion and stenosis of bilateral carotid arteries: Principal | ICD-10-CM | POA: Diagnosis present

## 2016-03-23 DIAGNOSIS — F329 Major depressive disorder, single episode, unspecified: Secondary | ICD-10-CM | POA: Diagnosis present

## 2016-03-23 DIAGNOSIS — I739 Peripheral vascular disease, unspecified: Secondary | ICD-10-CM | POA: Diagnosis present

## 2016-03-23 DIAGNOSIS — F0390 Unspecified dementia without behavioral disturbance: Secondary | ICD-10-CM | POA: Diagnosis present

## 2016-03-23 HISTORY — PX: PATCH ANGIOPLASTY: SHX6230

## 2016-03-23 HISTORY — PX: ENDARTERECTOMY: SHX5162

## 2016-03-23 LAB — CBC
HCT: 42.5 % (ref 39.0–52.0)
HEMOGLOBIN: 13.5 g/dL (ref 13.0–17.0)
MCH: 28.2 pg (ref 26.0–34.0)
MCHC: 31.8 g/dL (ref 30.0–36.0)
MCV: 88.7 fL (ref 78.0–100.0)
PLATELETS: 399 10*3/uL (ref 150–400)
RBC: 4.79 MIL/uL (ref 4.22–5.81)
RDW: 15.3 % (ref 11.5–15.5)
WBC: 14.4 10*3/uL — AB (ref 4.0–10.5)

## 2016-03-23 LAB — URINALYSIS, ROUTINE W REFLEX MICROSCOPIC
Bilirubin Urine: NEGATIVE
Glucose, UA: NEGATIVE mg/dL
Hgb urine dipstick: NEGATIVE
Ketones, ur: NEGATIVE mg/dL
Leukocytes, UA: NEGATIVE
NITRITE: NEGATIVE
Protein, ur: NEGATIVE mg/dL
SPECIFIC GRAVITY, URINE: 1.02 (ref 1.005–1.030)
pH: 5.5 (ref 5.0–8.0)

## 2016-03-23 LAB — CREATININE, SERUM
CREATININE: 1.3 mg/dL — AB (ref 0.61–1.24)
GFR calc non Af Amer: 51 mL/min — ABNORMAL LOW (ref >60)
GFR, EST AFRICAN AMERICAN: 59 mL/min — AB (ref >60)

## 2016-03-23 SURGERY — ENDARTERECTOMY, CAROTID
Anesthesia: General | Site: Neck | Laterality: Left

## 2016-03-23 MED ORDER — SODIUM CHLORIDE 0.9 % IV SOLN
INTRAVENOUS | Status: DC
  Administered 2016-03-23: 17:00:00 via INTRAVENOUS

## 2016-03-23 MED ORDER — 0.9 % SODIUM CHLORIDE (POUR BTL) OPTIME
TOPICAL | Status: DC | PRN
  Administered 2016-03-23: 1000 mL

## 2016-03-23 MED ORDER — ACETAMINOPHEN 325 MG RE SUPP
325.0000 mg | RECTAL | Status: DC | PRN
Start: 2016-03-23 — End: 2016-03-24
  Filled 2016-03-23: qty 2

## 2016-03-23 MED ORDER — LIDOCAINE HCL 4 % EX SOLN
CUTANEOUS | Status: DC | PRN
  Administered 2016-03-23: 1 mL via TOPICAL

## 2016-03-23 MED ORDER — PROPOFOL 10 MG/ML IV BOLUS
INTRAVENOUS | Status: DC | PRN
  Administered 2016-03-23: 140 mg via INTRAVENOUS

## 2016-03-23 MED ORDER — ONDANSETRON HCL 4 MG/2ML IJ SOLN
INTRAMUSCULAR | Status: DC | PRN
  Administered 2016-03-23: 4 mg via INTRAVENOUS

## 2016-03-23 MED ORDER — PROTAMINE SULFATE 10 MG/ML IV SOLN
INTRAVENOUS | Status: DC | PRN
  Administered 2016-03-23 (×5): 10 mg via INTRAVENOUS

## 2016-03-23 MED ORDER — ONDANSETRON HCL 4 MG/2ML IJ SOLN: 4.0000 mg | Freq: Four times a day (QID) | INTRAMUSCULAR | Status: DC | PRN

## 2016-03-23 MED ORDER — PHENOL 1.4 % MT LIQD: 1.0000 | OROMUCOSAL | Status: DC | PRN

## 2016-03-23 MED ORDER — LACTATED RINGERS IV SOLN
INTRAVENOUS | Status: DC | PRN
  Administered 2016-03-23: 08:00:00 via INTRAVENOUS

## 2016-03-23 MED ORDER — GLYCOPYRROLATE 0.2 MG/ML IJ SOLN
INTRAMUSCULAR | Status: DC | PRN
  Administered 2016-03-23: 0.6 mg via INTRAVENOUS

## 2016-03-23 MED ORDER — DEXTROSE 5 % IV SOLN
1.5000 g | Freq: Two times a day (BID) | INTRAVENOUS | Status: AC
  Administered 2016-03-23 – 2016-03-24 (×2): 1.5 g via INTRAVENOUS
  Filled 2016-03-23 (×2): qty 1.5

## 2016-03-23 MED ORDER — ONDANSETRON HCL 4 MG/2ML IJ SOLN
INTRAMUSCULAR | Status: AC
  Filled 2016-03-23: qty 2

## 2016-03-23 MED ORDER — SUCCINYLCHOLINE CHLORIDE 20 MG/ML IJ SOLN
INTRAMUSCULAR | Status: AC
  Filled 2016-03-23: qty 1

## 2016-03-23 MED ORDER — POTASSIUM CHLORIDE CRYS ER 20 MEQ PO TBCR: 20.0000 meq | EXTENDED_RELEASE_TABLET | Freq: Every day | ORAL | Status: DC | PRN

## 2016-03-23 MED ORDER — OXYCODONE-ACETAMINOPHEN 5-325 MG PO TABS: 1.0000 | ORAL_TABLET | ORAL | Status: DC | PRN

## 2016-03-23 MED ORDER — HEPARIN SODIUM (PORCINE) 1000 UNIT/ML IJ SOLN
INTRAMUSCULAR | Status: AC
  Filled 2016-03-23: qty 1

## 2016-03-23 MED ORDER — PANTOPRAZOLE SODIUM 40 MG PO TBEC
40.0000 mg | DELAYED_RELEASE_TABLET | Freq: Every day | ORAL | Status: DC
  Administered 2016-03-24: 40 mg via ORAL
  Filled 2016-03-23: qty 1

## 2016-03-23 MED ORDER — LIDOCAINE HCL (PF) 1 % IJ SOLN
INTRAMUSCULAR | Status: AC
  Filled 2016-03-23: qty 30

## 2016-03-23 MED ORDER — BISACODYL 10 MG RE SUPP
10.0000 mg | Freq: Every day | RECTAL | Status: DC | PRN
  Administered 2016-03-23: 10 mg via RECTAL
  Filled 2016-03-23: qty 1

## 2016-03-23 MED ORDER — METOPROLOL TARTRATE 25 MG PO TABS
25.0000 mg | ORAL_TABLET | Freq: Two times a day (BID) | ORAL | Status: DC
  Administered 2016-03-23 – 2016-03-24 (×2): 25 mg via ORAL
  Filled 2016-03-23 (×2): qty 1

## 2016-03-23 MED ORDER — LIDOCAINE HCL (CARDIAC) 20 MG/ML IV SOLN
INTRAVENOUS | Status: AC
  Filled 2016-03-23: qty 5

## 2016-03-23 MED ORDER — NEOSTIGMINE METHYLSULFATE 10 MG/10ML IV SOLN
INTRAVENOUS | Status: DC | PRN
  Administered 2016-03-23: 4 mg via INTRAVENOUS

## 2016-03-23 MED ORDER — WHITE PETROLATUM GEL
Status: AC
  Administered 2016-03-23: 18:00:00
  Filled 2016-03-23: qty 1

## 2016-03-23 MED ORDER — MIDAZOLAM HCL 2 MG/2ML IJ SOLN
INTRAMUSCULAR | Status: AC
  Filled 2016-03-23: qty 2

## 2016-03-23 MED ORDER — ALUM & MAG HYDROXIDE-SIMETH 200-200-20 MG/5ML PO SUSP
15.0000 mL | ORAL | Status: DC | PRN
  Administered 2016-03-24: 30 mL via ORAL
  Filled 2016-03-23: qty 30

## 2016-03-23 MED ORDER — OXYCODONE-ACETAMINOPHEN 5-325 MG PO TABS
ORAL_TABLET | ORAL | Status: AC
  Administered 2016-03-23: 2 via ORAL
  Filled 2016-03-23: qty 2

## 2016-03-23 MED ORDER — METOPROLOL TARTRATE 1 MG/ML IV SOLN: 2.0000 mg | INTRAVENOUS | Status: DC | PRN

## 2016-03-23 MED ORDER — HYDRALAZINE HCL 20 MG/ML IJ SOLN: 5.0000 mg | INTRAMUSCULAR | Status: DC | PRN

## 2016-03-23 MED ORDER — SODIUM CHLORIDE 0.9 % IV SOLN
0.0125 ug/kg/min | INTRAVENOUS | Status: DC
  Filled 2016-03-23: qty 2000

## 2016-03-23 MED ORDER — CHLORHEXIDINE GLUCONATE CLOTH 2 % EX PADS: 6.0000 | MEDICATED_PAD | Freq: Once | CUTANEOUS | Status: DC

## 2016-03-23 MED ORDER — SUCCINYLCHOLINE 20MG/ML (10ML) SYRINGE FOR MEDFUSION PUMP - OPTIME
INTRAMUSCULAR | Status: DC | PRN
  Administered 2016-03-23: 100 mg via INTRAVENOUS

## 2016-03-23 MED ORDER — DOCUSATE SODIUM 100 MG PO CAPS
100.0000 mg | ORAL_CAPSULE | Freq: Every day | ORAL | Status: DC
  Administered 2016-03-24: 100 mg via ORAL
  Filled 2016-03-23: qty 1

## 2016-03-23 MED ORDER — LABETALOL HCL 5 MG/ML IV SOLN
10.0000 mg | INTRAVENOUS | Status: DC | PRN
  Administered 2016-03-23: 5 mg via INTRAVENOUS
  Administered 2016-03-24: 10 mg via INTRAVENOUS
  Filled 2016-03-23: qty 4

## 2016-03-23 MED ORDER — ROCURONIUM BROMIDE 100 MG/10ML IV SOLN
INTRAVENOUS | Status: DC | PRN
  Administered 2016-03-23: 10 mg via INTRAVENOUS
  Administered 2016-03-23: 20 mg via INTRAVENOUS

## 2016-03-23 MED ORDER — LABETALOL HCL 5 MG/ML IV SOLN
INTRAVENOUS | Status: AC
  Administered 2016-03-23: 5 mg via INTRAVENOUS
  Filled 2016-03-23: qty 4

## 2016-03-23 MED ORDER — GUAIFENESIN-DM 100-10 MG/5ML PO SYRP: 15.0000 mL | ORAL_SOLUTION | ORAL | Status: DC | PRN

## 2016-03-23 MED ORDER — LIDOCAINE HCL (CARDIAC) 20 MG/ML IV SOLN
INTRAVENOUS | Status: DC | PRN
  Administered 2016-03-23: 60 mg via INTRAVENOUS

## 2016-03-23 MED ORDER — MAGNESIUM SULFATE 2 GM/50ML IV SOLN: 2.0000 g | Freq: Every day | INTRAVENOUS | Status: DC | PRN

## 2016-03-23 MED ORDER — HYDROMORPHONE HCL 1 MG/ML IJ SOLN
INTRAMUSCULAR | Status: AC
  Administered 2016-03-23: 0.25 mg via INTRAVENOUS
  Filled 2016-03-23: qty 1

## 2016-03-23 MED ORDER — ASPIRIN EC 325 MG PO TBEC
325.0000 mg | DELAYED_RELEASE_TABLET | Freq: Every day | ORAL | Status: DC
  Administered 2016-03-24: 325 mg via ORAL
  Filled 2016-03-23: qty 1

## 2016-03-23 MED ORDER — SENNOSIDES-DOCUSATE SODIUM 8.6-50 MG PO TABS: 1.0000 | ORAL_TABLET | Freq: Every evening | ORAL | Status: DC | PRN

## 2016-03-23 MED ORDER — MORPHINE SULFATE (PF) 2 MG/ML IV SOLN
INTRAVENOUS | Status: AC
  Administered 2016-03-23: 2 mg via INTRAVENOUS
  Filled 2016-03-23: qty 1

## 2016-03-23 MED ORDER — SODIUM CHLORIDE 0.9 % IV SOLN
INTRAVENOUS | Status: DC | PRN
  Administered 2016-03-23: 09:00:00

## 2016-03-23 MED ORDER — FENTANYL CITRATE (PF) 250 MCG/5ML IJ SOLN
INTRAMUSCULAR | Status: AC
  Filled 2016-03-23: qty 5

## 2016-03-23 MED ORDER — ENOXAPARIN SODIUM 40 MG/0.4ML ~~LOC~~ SOLN
40.0000 mg | SUBCUTANEOUS | Status: DC
  Administered 2016-03-24: 40 mg via SUBCUTANEOUS
  Filled 2016-03-23: qty 0.4

## 2016-03-23 MED ORDER — OXYCODONE-ACETAMINOPHEN 5-325 MG PO TABS
1.0000 | ORAL_TABLET | ORAL | Status: DC | PRN
  Administered 2016-03-23: 2 via ORAL
  Administered 2016-03-23: 1 via ORAL
  Administered 2016-03-23: 2 via ORAL
  Filled 2016-03-23: qty 1

## 2016-03-23 MED ORDER — MORPHINE SULFATE (PF) 2 MG/ML IV SOLN
2.0000 mg | INTRAVENOUS | Status: DC | PRN
  Administered 2016-03-23: 2 mg via INTRAVENOUS

## 2016-03-23 MED ORDER — HYDROMORPHONE HCL 1 MG/ML IJ SOLN
0.2500 mg | INTRAMUSCULAR | Status: DC | PRN
  Administered 2016-03-23: 0.25 mg via INTRAVENOUS
  Administered 2016-03-23: 0.5 mg via INTRAVENOUS
  Administered 2016-03-23: 0.25 mg via INTRAVENOUS

## 2016-03-23 MED ORDER — PHENYLEPHRINE HCL 10 MG/ML IJ SOLN
10.0000 mg | INTRAVENOUS | Status: DC | PRN
  Administered 2016-03-23: 10 ug/min via INTRAVENOUS

## 2016-03-23 MED ORDER — SODIUM CHLORIDE 0.9 % IV SOLN: 500.0000 mL | Freq: Once | INTRAVENOUS | Status: DC | PRN

## 2016-03-23 MED ORDER — SODIUM CHLORIDE 0.9 % IV SOLN
0.0125 ug/kg/min | INTRAVENOUS | Status: AC
  Administered 2016-03-23: .05 ug/kg/min via INTRAVENOUS
  Filled 2016-03-23: qty 1000

## 2016-03-23 MED ORDER — FENTANYL CITRATE (PF) 100 MCG/2ML IJ SOLN
INTRAMUSCULAR | Status: DC | PRN
  Administered 2016-03-23: 100 ug via INTRAVENOUS

## 2016-03-23 MED ORDER — LIDOCAINE HCL (CARDIAC) 20 MG/ML IV SOLN
INTRAVENOUS | Status: AC
  Filled 2016-03-23: qty 10

## 2016-03-23 MED ORDER — ACETAMINOPHEN 325 MG PO TABS: 325.0000 mg | ORAL_TABLET | ORAL | Status: DC | PRN

## 2016-03-23 MED ORDER — PROPOFOL 10 MG/ML IV BOLUS
INTRAVENOUS | Status: AC
  Filled 2016-03-23: qty 20

## 2016-03-23 MED ORDER — ROCURONIUM BROMIDE 50 MG/5ML IV SOLN
INTRAVENOUS | Status: AC
  Filled 2016-03-23: qty 1

## 2016-03-23 MED ORDER — HEPARIN SODIUM (PORCINE) 1000 UNIT/ML IJ SOLN
INTRAMUSCULAR | Status: DC | PRN
Start: 2016-03-23 — End: 2016-03-23
  Administered 2016-03-23: 6000 [IU] via INTRAVENOUS

## 2016-03-23 MED ORDER — OXYCODONE-ACETAMINOPHEN 5-325 MG PO TABS
ORAL_TABLET | ORAL | Status: AC
  Filled 2016-03-23: qty 2

## 2016-03-23 SURGICAL SUPPLY — 37 items
CANISTER SUCTION 2500CC (MISCELLANEOUS) ×3 IMPLANT
CATH ROBINSON RED A/P 18FR (CATHETERS) ×3 IMPLANT
CATH SUCT 10FR WHISTLE TIP (CATHETERS) ×3 IMPLANT
CLIP TI MEDIUM 24 (CLIP) ×3 IMPLANT
CLIP TI WIDE RED SMALL 24 (CLIP) ×3 IMPLANT
CRADLE DONUT ADULT HEAD (MISCELLANEOUS) ×3 IMPLANT
DRAIN HEMOVAC 1/8 X 5 (WOUND CARE) IMPLANT
DRSG COVADERM 4X8 (GAUZE/BANDAGES/DRESSINGS) ×2 IMPLANT
ELECT REM PT RETURN 9FT ADLT (ELECTROSURGICAL) ×3
ELECTRODE REM PT RTRN 9FT ADLT (ELECTROSURGICAL) ×1 IMPLANT
EVACUATOR SILICONE 100CC (DRAIN) IMPLANT
GLOVE BIO SURGEON STRL SZ 6.5 (GLOVE) ×1 IMPLANT
GLOVE BIO SURGEONS STRL SZ 6.5 (GLOVE) ×1
GLOVE BIOGEL PI IND STRL 6.5 (GLOVE) IMPLANT
GLOVE BIOGEL PI IND STRL 7.5 (GLOVE) ×1 IMPLANT
GLOVE BIOGEL PI INDICATOR 6.5 (GLOVE) ×10
GLOVE BIOGEL PI INDICATOR 7.5 (GLOVE) ×2
GLOVE ECLIPSE 6.5 STRL STRAW (GLOVE) ×6 IMPLANT
GLOVE ECLIPSE 7.0 STRL STRAW (GLOVE) ×2 IMPLANT
GLOVE SS BIOGEL STRL SZ 7 (GLOVE) ×1 IMPLANT
GLOVE SUPERSENSE BIOGEL SZ 7 (GLOVE) ×2
GOWN STRL REUS W/ TWL LRG LVL3 (GOWN DISPOSABLE) ×3 IMPLANT
GOWN STRL REUS W/TWL LRG LVL3 (GOWN DISPOSABLE) ×18
INSERT FOGARTY SM (MISCELLANEOUS) ×3 IMPLANT
KIT BASIN OR (CUSTOM PROCEDURE TRAY) ×3 IMPLANT
KIT ROOM TURNOVER OR (KITS) ×3 IMPLANT
NS IRRIG 1000ML POUR BTL (IV SOLUTION) ×6 IMPLANT
PACK CAROTID (CUSTOM PROCEDURE TRAY) ×3 IMPLANT
PAD ARMBOARD 7.5X6 YLW CONV (MISCELLANEOUS) ×6 IMPLANT
PATCH HEMASHIELD 8X75 (Vascular Products) ×2 IMPLANT
SPONGE INTESTINAL PEANUT (DISPOSABLE) ×3 IMPLANT
SUT PROLENE 6 0 CC (SUTURE) ×5 IMPLANT
SUT SILK 2 0 FS (SUTURE) ×3 IMPLANT
SUT VIC AB 2-0 CT1 27 (SUTURE) ×3
SUT VIC AB 2-0 CT1 TAPERPNT 27 (SUTURE) ×1 IMPLANT
SUT VIC AB 3-0 X1 27 (SUTURE) ×3 IMPLANT
WATER STERILE IRR 1000ML POUR (IV SOLUTION) ×3 IMPLANT

## 2016-03-23 NOTE — Interval H&P Note (Signed)
History and Physical Interval Note:  03/23/2016 8:00 AM  [name]  has presented today for surgery, with the diagnosis of Left carotid artery stenosis I65.22  The various methods of treatment have been discussed with the patient and family. After consideration of risks, benefits and other options for treatment, the patient has consented to  Procedure(s): ENDARTERECTOMY CAROTID (Left) as a surgical intervention .  The patient's history has been reviewed, patient examined, no change in status, stable for surgery.  I have reviewed the patient's chart and labs.  Questions were answered to the patient's satisfaction.     Tinnie Gens

## 2016-03-23 NOTE — Progress Notes (Signed)
Nurse called Dr. Conrad [place] and informed him that patient was prescribed Metoprolol 25 mg to take BID, however patient orders his medications from a mail order pharmacy and patient has not received Metoprolol yet. Dr. Conrad [place] stated not to give the first dose of Metoprolol before surgery. Will inform primary Nurse Caren Griffins, RN of this.

## 2016-03-23 NOTE — Transfer of Care (Signed)
Immediate Anesthesia Transfer of Care Note  Patient: [name]  Procedure(s) Performed: Procedure(s): ENDARTERECTOMY LEFT CAROTID ARTERY (Left) PATCH ANGIOPLASTY LEFT CAROTID ARTERY USING HEMASHIELD PLATINUM FINESSE PATCH (Left)  Patient Location: PACU  Anesthesia Type:General  Level of Consciousness: awake, alert  and oriented  Airway & Oxygen Therapy: Patient Spontanous Breathing and Patient connected to nasal cannula oxygen  Post-op Assessment: Report given to RN, Post -op Vital signs reviewed and stable and Patient moving all extremities X 4  Post vital signs: Reviewed and stable  Last Vitals:  Filed Vitals:   03/23/16 0643 03/23/16 1011  BP: 199/109   Pulse: 63   Temp: 36.6 C 37.1 C  Resp: 20 17    Complications: No apparent anesthesia complications

## 2016-03-23 NOTE — Progress Notes (Signed)
  Day of Surgery Note    Subjective:  Fidgety in pacu.  In restraints as he keeps pulling at his dressing.  Filed Vitals:   03/23/16 1115 03/23/16 1130  BP: 169/90 168/89  Pulse: 54 63  Temp:    Resp: 20 19    Incisions:   Bandage is dry Extremities:  Moving all extremities equally  Lungs:  Non labored Neuro:  In tact.  Assessment/Plan:  This is a 82 y.o. male who is s/p left carotid endarterectomy  -pt neurologically in tact but is fidgety in pacu & in soft restraints.  Moving all extremities equally.   Alert to time, place. -continue BP control  -to 3 south when bed available.   Leontine Locket, PA-C 03/23/2016 12:28 PM

## 2016-03-23 NOTE — Anesthesia Postprocedure Evaluation (Signed)
Anesthesia Post Note  Patient: [name]  Procedure(s) Performed: Procedure(s) (LRB): ENDARTERECTOMY LEFT CAROTID ARTERY (Left) PATCH ANGIOPLASTY LEFT CAROTID ARTERY USING HEMASHIELD PLATINUM FINESSE PATCH (Left)  Patient location during evaluation: PACU Anesthesia Type: General Level of consciousness: awake and alert Pain management: pain level controlled Vital Signs Assessment: post-procedure vital signs reviewed and stable Respiratory status: spontaneous breathing, nonlabored ventilation and respiratory function stable Cardiovascular status: blood pressure returned to baseline and stable Postop Assessment: no signs of nausea or vomiting Anesthetic complications: no    Last Vitals:  Filed Vitals:   03/23/16 1615 03/23/16 1617  BP:  147/68  Pulse: 58 64  Temp: 36.4 C   Resp: 17 18    Last Pain:  Filed Vitals:   03/23/16 1649  PainSc: 5                  Zendayah Hardgrave A

## 2016-03-23 NOTE — Progress Notes (Signed)
       Patient was agitated and hypertensive.  RN gave IV labetalol.  We have ordered Metoprolol PO BID 25 mg from his home medication list.   Theda Sers, Elasia Furnish MAUREEN PA-C

## 2016-03-23 NOTE — Op Note (Signed)
OPERATIVE REPORT  Date of Surgery: 03/23/2016  Surgeon: Tinnie Gens, MD  Assistant: Cassell Clement  Pre-op Diagnosis: Left carotid artery stenosis I65.22-severe-asymptomatic  Post-op Diagnosis: Left carotid artery stenosis I65.22  Procedure: Procedure(s): ENDARTERECTOMY LEFT CAROTID ARTERY PATCH ANGIOPLASTY LEFT CAROTID ARTERY USING HEMASHIELD PLATINUM FINESSE PATCH  Anesthesia: General  EBL: [id number] cc  Complications: None  Procedure Details: The patient was taken to the operating room and placed in the supine position. Following induction of satisfactory general endotracheal anesthesia the left neck was prepped and draped in a routine sterile manner. Incision was made on the anterior border of the sternocleidomastoid muscle and carried down through the subcutaneous tissue and platysma using the Bovie. Care was taken not to injure the hypoglossal nerve.. The common internal and external carotid arteries were dissected free. There was a calcified atherosclerotic plaque at the carotid bifurcation extending up the internal carotid artery. A #10 shunt was then prepared and the patient was heparinized. The carotid vessels were occluded with vascular clamps. A longitudinal opening was made in the common carotid with a 15 blade extended up the internal carotid with the Potts scissors to a point distal to the disease. The plaque was approximately 95 % stenotic in severity. The distal vessel appeared normal. Shunt was inserted without difficulty reestablishing flow in about 2 minutes. A standard endarterectomy was performed with an eversion endarterectomy of the external carotid. The plaque feathered off  the distal internal carotid artery nicely not requiring any tacking sutures. The lumen was thoroughly irrigated with heparinized saline and loose debris all carefully removed. The arterotomy was then closed with a patch using continuous 6-0 Prolene. Prior to completion of the  Closure the  shunt was  removed after approximately 30 minutes of shunt time. Flow was then reestablished up the external branch initially followed by the internal branch. Protamine was given to her reverse the heparin.Following adequate hemostasis the wound was irrigated with saline and closed in layers with Vicryl ain a subcuticular fashion. Sterile dressing was applied and the patient taken to the recovery room in stable condition.  Tinnie Gens, MD 03/23/2016 10:14 AM

## 2016-03-23 NOTE — Anesthesia Procedure Notes (Signed)
Procedure Name: Intubation Date/Time: 03/23/2016 8:25 AM Performed by: Neldon Newport Pre-anesthesia Checklist: Patient identified, Emergency Drugs available, Suction available and Patient being monitored Patient Re-evaluated:Patient Re-evaluated prior to inductionOxygen Delivery Method: Circle system utilized Preoxygenation: Pre-oxygenation with 100% oxygen Intubation Type: IV induction Ventilation: Oral airway inserted - appropriate to patient size Laryngoscope Size: Sabra Heck and 2 Grade View: Grade I Tube type: Oral Tube size: 7.5 mm Number of attempts: 1 Airway Equipment and Method: Stylet Placement Confirmation: ETT inserted through vocal cords under direct vision,  positive ETCO2 and breath sounds checked- equal and bilateral Secured at: 22 cm Tube secured with: Tape Dental Injury: Teeth and Oropharynx as per pre-operative assessment

## 2016-03-23 NOTE — H&P (View-Only) (Signed)
Subjective:     Patient ID: [name], male   DOB: Jan 23, 1934, 82 y.o.   MRN: [id number]  HPI  This 82 year old male returns to the office today with his wife and daughter to discuss left carotid endarterectomy. Patient was seen by me in May 2016 at which time he had a severe left ICA stenosis which was probably asymptomatic. He does have some dizziness and some dementia problems and occasionally will develop weakness in both lower extremities according to his wife but has never had clear-cut left brain TIAs or left brain CVA. He has been recently seen by Dr. Leonie Man  And Dr.Arrida  Who referred him back to me because a repeat carotid ultrasound performed 02/23/2016 reveals a more severe left internal carotid stenosis than last year. Velocities are now 522/192 in the ICA which correlates with an approximate 95% ICA stenosis.   Past Medical History  Diagnosis Date  . Depression   . Erectile dysfunction   . GERD (gastroesophageal reflux disease)   . PAD (peripheral artery disease) ([place])     Social History  Substance Use Topics  . Smoking status: Former Research scientist (life sciences)  . Smokeless tobacco: Never Used  . Alcohol Use: No    Family History  Problem Relation Age of Onset  . Cancer Father     Allergies  Allergen Reactions  . Other     Aspertone, Artificial Sweetner, makes crazy per pt  . Valium [Diazepam]     Makes crazy per pt Also causes amnesia, per pt      Current outpatient prescriptions:  .  Biotin 5000 MCG CAPS, Take 2,500 mcg by mouth daily. , Disp: , Rfl:  .  Methylfol-Methylcob-Acetylcyst (METAFOLBIC PLUS) 6-2-600 MG TABS, Take 1 tablet by mouth daily., Disp: 30 tablet, Rfl: 11 .  Omega-3 Fatty Acids ([employer] FISH OIL) 1200 MG CAPS, Take 1,000 mg by mouth once a week. , Disp: , Rfl:  .  Probiotic Product (PROBIOTIC DAILY PO), Take 1 tablet by mouth daily. , Disp: , Rfl:  .  vitamin A 10000 UNIT capsule, Take 10,000 Units by mouth daily., Disp: , Rfl:  .  vitamin C (ASCORBIC ACID)  500 MG tablet, Take 500 mg by mouth daily., Disp: , Rfl:  .  vitamin C (ASCORBIC ACID) 500 MG tablet, Use as directed 500 mg in the mouth or throat daily. Reported on 03/08/2016, Disp: , Rfl:   Filed Vitals:   03/08/16 1609 03/08/16 1612 03/08/16 1613  BP: 170/93 180/93 183/92  Pulse: 73 76 76  Temp: 96.6 F (35.9 C)    Resp: 16    Height: 5\' 11"  (1.803 m)    Weight: 163 lb (73.936 kg)    SpO2: 97%      Body mass index is 22.74 kg/(m^2).          Review of Systems Patient has memory problems. Has history of GERD. Denies active chest pain, dyspnea on exertion, PND, orthopnea, hemoptysis. Most questions are answered by his daughter or wife. His affect is flat. Other systems negativeand complete eview of ystems     Objective:   Physical Exam BP 183/92 mmHg  Pulse 76  Temp(Src) 96.6 F (35.9 C)  Resp 16  Ht 5\' 11"  (1.803 m)  Wt 163 lb (73.936 kg)  BMI 22.74 kg/m2  SpO2 97%  Gen.-alert and oriented x3 in no apparent distress HEENT normal for age Lungs no rhonchi or wheezing Cardiovascular regular rhythm no murmurs carotid pulses 3+ palpable -harsh left carotid bruit  Abdomen soft nontender no palpable masses Musculoskeletal free of  major deformities Skin clear -no rashes Neurologic normal except flat affect and decreased memory Lower extremities 3+ femoral and dorsalis pedis pulses palpable bilaterally with no edema   today I reviewed the carotid duplex exam from March 2020 17 and compared it with the carotid duplex exam from May 2016. It appears that the left ICA stenosis as a proximally 95% with a mild right ICA stenosis      Assessment:      #195% left ICA stenosis-likely asymptomatic possibly causing some dizziness  #2 poor memory - possible dementia   #3 GERD #4 PAD with previous stents placed by [place]:      I once again discussed the situation at length with patient's wife daughter and the patient explaining that this severe stenosis has  gotten worse and is likely asymptomatic. This could result in a stroke or no symptoms or TIAs.  I have recommended left carotid endarterectomy and discussed the risks involved and once again they would like to consider this. They would like to have a preoperative consultation with anesthesiology because they state that his memory has never been the same since IV sedation prior to a stent procedure on his legs    they will notify our office if they would like to proceed with left carotid endarterectomy and Colletta Maryland will facilitate a preoperative consultation with anesthesiology to discuss these issues. We will wait to see what family decides

## 2016-03-24 ENCOUNTER — Encounter (HOSPITAL_COMMUNITY): Payer: Self-pay | Admitting: Vascular Surgery

## 2016-03-24 LAB — CBC
HCT: 40.8 % (ref 39.0–52.0)
HEMOGLOBIN: 12.9 g/dL — AB (ref 13.0–17.0)
MCH: 28.2 pg (ref 26.0–34.0)
MCHC: 31.6 g/dL (ref 30.0–36.0)
MCV: 89.1 fL (ref 78.0–100.0)
Platelets: 418 10*3/uL — ABNORMAL HIGH (ref 150–400)
RBC: 4.58 MIL/uL (ref 4.22–5.81)
RDW: 15.3 % (ref 11.5–15.5)
WBC: 13.8 10*3/uL — AB (ref 4.0–10.5)

## 2016-03-24 LAB — BASIC METABOLIC PANEL
ANION GAP: 11 (ref 5–15)
BUN: 21 mg/dL — AB (ref 6–20)
CHLORIDE: 103 mmol/L (ref 101–111)
CO2: 24 mmol/L (ref 22–32)
Calcium: 8.4 mg/dL — ABNORMAL LOW (ref 8.9–10.3)
Creatinine, Ser: 1.17 mg/dL (ref 0.61–1.24)
GFR calc Af Amer: >60 mL/min (ref >60)
GFR calc non Af Amer: 57 mL/min — ABNORMAL LOW (ref >60)
Glucose, Bld: 113 mg/dL — ABNORMAL HIGH (ref 65–99)
POTASSIUM: 4.1 mmol/L (ref 3.5–5.1)
SODIUM: 138 mmol/L (ref 135–145)

## 2016-03-24 NOTE — Progress Notes (Signed)
Patient with approx. 125ml of dark/black emesis.

## 2016-03-24 NOTE — Progress Notes (Addendum)
[facility], he wants to go home as soon as possible.  He still has not voided.  He doesn't want to go home with a foley.  He vomited black liquid this am.  No history of stomach ulcers or GI bleeds.   Objective 162/75 87 97.6 F (36.4 C) (Oral) 18 95%  Intake/Output Summary (Last 24 hours) at 03/24/16 0731 Last data filed at 03/24/16 0600  Gross per 24 hour  Intake 1726.67 ml  Output   1100 ml  Net 626.67 ml   Left neck incision clean dry and soft without hematoma No tongue deviation, smile is symmetric Palpable pulses equal bilaterally   Assessment/Planning: POD # left CEA He needs to void before discharge or we will need to send him home with a foley per Dr. Evelena Leyden recommendations No nausea  Ambulating with rolling walker BP medication is on mail order metoprolol BID 25 mg    Laurence Slate [facility] 03/24/2016 7:31 AM --  Laboratory Lab Results:  Recent Labs  03/23/16 1830 03/24/16 0440  WBC 14.4* 13.8*  HGB 13.5 12.9*  HCT 42.5 40.8  PLT 399 418*   BMET  Recent Labs  03/23/16 1830 03/24/16 0440  NA  --  138  K  --  4.1  CL  --  103  CO2  --  24  GLUCOSE  --  113*  BUN  --  21*  CREATININE 1.30* 1.17  CALCIUM  --  8.4*    COAG Lab Results  Component Value Date   INR 1.15 03/16/2016   INR 1.00 09/12/2011   No results found for: PTT Patient is neurologically intact Left neck incision looks good with no hematoma Only problem at the moment is his inability to void Apparently this has been an issue in the past and he has been seen by [facility] a few years ago Patient once to go home but I cautioned him that if he was discharged without voiding he may end up in the emergency department later on this evening  Plan is to see if he can void by 4 PM today if not we will insert Foley catheter and discharge patient and arrange for follow-up with [facility] early next week  This  plan is agreeable with patient wife and daughter who are at the bedside

## 2016-03-24 NOTE — Progress Notes (Addendum)
Patients BP 195/83 patient is refusing PRN blood pressure medications at this time.  Educated patient on importance and risks associated with not taking the medication.  Patient and patients wife continue to refuse.  Patient requesting to go AMA unable to assess patients competence due to non compliance with neuro assessment.  MD notified.  Will continue to follow up.

## 2016-03-24 NOTE — Progress Notes (Addendum)
Patient refusing to participate in assessment at this time.  Attempted multiple times to complete a neuro check, patient states "it's stupid", "I don't want to do it."  Educated patient on importance of performing assessment and risks associated with not.  Pt. Verbalized understanding but continued to state "I don't want it."  PA Collins updated on patients non compliance.  Will continue to follow up.

## 2016-03-24 NOTE — Progress Notes (Signed)
Discharge instructions given to patient, patients wife, and daughter all questions answered at this time.  Prescription given to patients daughter.  Pt. Refuses VS prior to discharge.

## 2016-03-24 NOTE — Care Management Note (Signed)
Case Management Note  Patient Details  Name: [name] MRN: [id number] Date of Birth: 12/28/1933  Subjective/Objective:      Patient is from home with wife, he is s/p CEA, he had foley dc'd and has not voided yet , he has also had some black emesis. Patient wants to go home, even though refusing neuro checks and bp meds.  NCM will cont to follow for dc needs.              Action/Plan:   Expected Discharge Date:  03/24/16               Expected Discharge Plan:  Home/Self Care  In-House Referral:     Discharge planning Services  CM Consult  Post Acute Care Choice:    Choice offered to:     DME Arranged:    DME Agency:     HH Arranged:    HH Agency:     Status of Service:  Completed, signed off  Medicare Important Message Given:    Date Medicare IM Given:    Medicare IM give by:    Date Additional Medicare IM Given:    Additional Medicare Important Message give by:     If discussed at [place] of Stay Meetings, dates discussed:    Additional Comments:  Zenon Mayo, RN 03/24/2016, 2:59 PM

## 2016-03-24 NOTE — Progress Notes (Signed)
Patient unable to voids after multiple attempts to urinate. Bladder scanned Patient for 653 ml of urine. MD notified order to place Foley received. Pateint agreeable. Foley placed immediate cleat amber urine. Patient tolerated well

## 2016-03-28 LAB — TYPE AND SCREEN
ABO/RH(D): B POS
Antibody Screen: NEGATIVE
UNIT DIVISION: 0
Unit division: 0

## 2016-04-01 ENCOUNTER — Telehealth: Payer: Self-pay | Admitting: Vascular Surgery

## 2016-04-01 NOTE — Telephone Encounter (Signed)
sched appt for 4/25 at 53. Lm to inform pt of appt.

## 2016-04-01 NOTE — Telephone Encounter (Signed)
-----   Message from Mena Goes, RN sent at 03/24/2016  9:14 AM EDT ----- Regarding: schedule   ----- Message -----    From: Ulyses Amor, PA-C    Sent: 03/24/2016   8:42 AM      To: [facility] Charge Pool  F/U in 2 weeks with Dr. Kellie Simmering s/p left CEA

## 2016-04-06 ENCOUNTER — Encounter: Payer: Self-pay | Admitting: Vascular Surgery

## 2016-04-06 NOTE — Discharge Summary (Signed)
[facility] Discharge Summary   Patient ID:  [name] MRN: [id number] DOB/AGE: 1934/09/30 82 y.o.  Admit date: 03/23/2016 Discharge date: 03/24/2016 Date of Surgery: 03/23/2016 Surgeon: Surgeon(s): Mal Misty, MD  Admission Diagnosis: Left carotid artery stenosis I65.22  Discharge Diagnoses:  Left carotid artery stenosis I65.22  Secondary Diagnoses: Past Medical History  Diagnosis Date  . Depression   . Erectile dysfunction   . GERD (gastroesophageal reflux disease)   . PAD (peripheral artery disease) ([place])   . Dysrhythmia     has had Metoprolol recommended, mail order has not delivered it yet.    . Complication of anesthesia     cognitive dysfunction > 6 months after a-gram with Versed/Fentanyl '12. Family attributes to Versed. Request that BZDs be avoided.    Procedure(s): ENDARTERECTOMY LEFT CAROTID ARTERY PATCH ANGIOPLASTY LEFT CAROTID ARTERY USING HEMASHIELD PLATINUM FINESSE PATCH  Discharged Condition: good  HPI:  This 82 year old male returns to the office today with his wife and daughter to discuss left carotid endarterectomy. Patient was seen by me in May 2016 at which time he had a severe left ICA stenosis which was probably asymptomatic. He does have some dizziness and some dementia problems and occasionally will develop weakness in both lower extremities according to his wife but has never had clear-cut left brain TIAs or left brain CVA. He has been recently seen by Dr. Leonie Man And Dr.Arrida Who referred him back to me because a repeat carotid ultrasound performed 02/23/2016 reveals a more severe left internal carotid stenosis than last year. Velocities are now 522/192 in the ICA which correlates with an approximate 95% ICA stenosis.   Hospital Course:  [name] is a 82 y.o. male is S/P  Procedure(s): ENDARTERECTOMY LEFT CAROTID ARTERY PATCH ANGIOPLASTY LEFT CAROTID ARTERY USING HEMASHIELD PLATINUM FINESSE PATCH Subjective -  Angry, he wants to go home as soon as possible. He still has not voided. He doesn't want to go home with a foley. He vomited black liquid this am. No history of stomach ulcers or GI bleeds.   Patient is neurologically intact Left neck incision looks good with no hematoma Only problem at the moment is his inability to void Apparently this has been an issue in the past and he has been seen by [facility] a few years ago Patient once to go home but I cautioned him that if he was discharged without voiding he may end up in the emergency department later on this evening  Plan is to see if he can void by 4 PM today if not we will insert Foley catheter and discharge patient and arrange for follow-up with [facility] early next week  This plan is agreeable with patient wife and daughter who are at the bedside  He was able to void and was discharge in stable condition. Significant Diagnostic Studies: CBC Lab Results  Component Value Date   WBC 13.8* 03/24/2016   HGB 12.9* 03/24/2016   HCT 40.8 03/24/2016   MCV 89.1 03/24/2016   PLT 418* 03/24/2016    BMET    Component Value Date/Time   NA 138 03/24/2016 0440   K 4.1 03/24/2016 0440   CL 103 03/24/2016 0440   CO2 24 03/24/2016 0440   GLUCOSE 113* 03/24/2016 0440   BUN 21* 03/24/2016 0440   CREATININE 1.17 03/24/2016 0440   CALCIUM 8.4* 03/24/2016 0440   GFRNONAA 57* 03/24/2016 0440   GFRAA >60 03/24/2016 0440   COAG Lab Results  Component  Value Date   INR 1.15 03/16/2016   INR 1.00 09/12/2011     Disposition:  Discharge to :Home Discharge Instructions    Call MD for:  redness, tenderness, or signs of infection (pain, swelling, bleeding, redness, odor or green/yellow discharge around incision site)    Complete by:  As directed      Call MD for:  severe or increased pain, loss or decreased feeling  in affected limb(s)    Complete by:  As directed      Call MD for:  temperature >100.5    Complete by:  As directed       Discharge instructions    Complete by:  As directed   You may shower in 24 hours.     Discharge patient    Complete by:  As directed   Discharge pt to home     Driving Restrictions    Complete by:  As directed   No driving for 1 week     Increase activity slowly    Complete by:  As directed   Walk with assistance use walker or cane as needed     Lifting restrictions    Complete by:  As directed   No heavy lifting for 6 weeks     Resume previous diet    Complete by:  As directed             Medication List    TAKE these medications        Biotin 5000 MCG Caps  Take 2,500 mcg by mouth daily.     Chlorpheniramine-DM 4-30 MG Tabs  Take 1 tablet by mouth daily as needed (for cold symptoms).     [employer] FISH OIL 1200 MG Caps  Take 1,000 mg by mouth once a week.     METAFOLBIC PLUS 6-2-600 MG Tabs  Take 1 tablet by mouth daily.     metoprolol tartrate 25 MG tablet  Commonly known as:  LOPRESSOR  Take 1 tablet (25 mg total) by mouth 2 (two) times daily.     oxyCODONE-acetaminophen 5-325 MG tablet  Commonly known as:  PERCOCET/ROXICET  Take 1-2 tablets by mouth every 4 (four) hours as needed for moderate pain.     PROBIOTIC DAILY PO  Take 1 tablet by mouth daily.     vitamin A 10000 UNIT capsule  Take 10,000 Units by mouth daily.     vitamin C 500 MG tablet  Commonly known as:  ASCORBIC ACID  Take 500 mg by mouth daily.       Verbal and written Discharge instructions given to the patient. Wound care per Discharge AVS     Follow-up Information    Follow up with Tinnie Gens, MD In 2 weeks.   Specialties:  Vascular Surgery, Interventional Cardiology, Cardiology   Why:  Office will call you to arrange your appt (sent)   Contact information:   [address] [place] [id number] [phone]       Signed: Roxy Horseman 04/06/2016, 12:35 PM  --- For VQI Registry use --- Instructions: Press F2 to tab through selections.  Delete question if not  applicable.   Modified Rankin score at D/C (0-6): Rankin Score=0  IV medication needed for:  1. Hypertension: No 2. Hypotension: No  Post-op Complications: No  1. Post-op CVA or TIA: No  If yes: Event classification (right eye, left eye, right cortical, left cortical, verterobasilar, other):   If yes: Timing of event (intra-op, <6 hrs post-op, >=  6 hrs post-op, unknown):   2. CN injury: No  If yes: CN  injuried   3. Myocardial infarction: No  If yes: Dx by (EKG or clinical, Troponin):   4.  CHF: No  5.  Dysrhythmia (new): No  6. Wound infection: No  7. Reperfusion symptoms: No  8. Return to OR: No  If yes: return to OR for (bleeding, neurologic, other CEA incision, other):   Discharge medications: Statin use:  No  for medical reason   ASA use:  No  for medical reason   Beta blocker use:  Yes ACE-Inhibitor use:  No  for medical reason   P2Y12 Antagonist use: [x ] None, [ ]  Plavix, [ ]  Plasugrel, [ ]  Ticlopinine, [ ]  Ticagrelor, [ ]  Other, [ ]  No for medical reason, [ ]  Non-compliant, [ ]  Not-indicated Anti-coagulant use:  [x ] None, [ ]  Warfarin, [ ]  Rivaroxaban, [ ]  Dabigatran, [ ]  Other, [ ]  No for medical reason, [ ]  Non-compliant, [ ]  Not-indicated

## 2016-04-12 ENCOUNTER — Encounter: Payer: Self-pay | Admitting: Vascular Surgery

## 2016-04-14 ENCOUNTER — Encounter: Payer: Self-pay | Admitting: Vascular Surgery

## 2016-04-19 ENCOUNTER — Ambulatory Visit (INDEPENDENT_AMBULATORY_CARE_PROVIDER_SITE_OTHER): Payer: Self-pay | Admitting: Vascular Surgery

## 2016-04-19 ENCOUNTER — Encounter: Payer: Self-pay | Admitting: Vascular Surgery

## 2016-04-19 VITALS — BP 187/93 | HR 61 | Temp 98.2°F | Resp 16 | Ht 71.0 in | Wt 152.0 lb

## 2016-04-19 DIAGNOSIS — I6522 Occlusion and stenosis of left carotid artery: Secondary | ICD-10-CM

## 2016-04-19 DIAGNOSIS — I6529 Occlusion and stenosis of unspecified carotid artery: Secondary | ICD-10-CM | POA: Insufficient documentation

## 2016-04-19 NOTE — Progress Notes (Signed)
Subjective:     Patient ID: [name], male   DOB: 20-Oct-1934, 82 y.o.   MRN: [id number]  HPI this 82 year old male returns 4 weeks post left carotid endarterectomy for severe asymptomatic left internal carotid stenosis. He has had no specific complaints. He denies lateralizing weakness, aphasia, amaurosis fugax, diplopia, blurred vision, syncope. He is not taking aspirin and does not desire to take aspirin. He is swallowing well with his had no hoarseness.   Review of Systems     Objective:   Physical Exam BP 187/93 mmHg  Pulse 61  Temp(Src) 98.2 F (36.8 C)  Resp 16  Ht 5\' 11"  (1.803 m)  Wt 152 lb (68.947 kg)  BMI 21.21 kg/m2  SpO2 98%   Gen. Elderly male no apparent distress alert and oriented 3  left neck incision has healed nicely. Mild edema resolving. No evidence of infection. 3+ carotid pulse with soft bruit.  Neurologic exam no focal deficits noted.     Assessment:      doing well 4 weeks post left carotid endarterectomy for severe asymptomatic left internal carotid stenosis     Plan:      return in 6 months with follow-up carotid duplex exam and see Dr. Servando Snare   family will be in touch with [place] if he develops any neurologic symptoms in the interim

## 2016-04-19 NOTE — Progress Notes (Signed)
Filed Vitals:   04/19/16 1457 04/19/16 1500  BP: 175/96 187/93  Pulse: 61 61  Temp: 98.2 F (36.8 C)   Resp: 16   Height: 5\' 11"  (1.803 m)   Weight: 152 lb (68.947 kg)   SpO2: 98%

## 2016-10-21 ENCOUNTER — Ambulatory Visit: Payer: Self-pay | Admitting: Vascular Surgery

## 2016-10-21 ENCOUNTER — Encounter (HOSPITAL_COMMUNITY): Payer: Self-pay

## 2016-10-28 ENCOUNTER — Encounter (HOSPITAL_COMMUNITY): Payer: Self-pay

## 2016-10-28 ENCOUNTER — Ambulatory Visit: Payer: Self-pay | Admitting: Vascular Surgery

## 2016-11-28 ENCOUNTER — Inpatient Hospital Stay (HOSPITAL_COMMUNITY)
Admission: EM | Admit: 2016-11-28 | Discharge: 2016-12-01 | DRG: 023 | Disposition: A | Discharge status: Hospice | Payer: Commercial Managed Care - HMO | Attending: Neurology | Admitting: Neurology

## 2016-11-28 ENCOUNTER — Emergency Department (HOSPITAL_COMMUNITY): Payer: Commercial Managed Care - HMO | Admitting: Anesthesiology

## 2016-11-28 ENCOUNTER — Encounter (HOSPITAL_COMMUNITY)
Admission: EM | Disposition: A | Discharge status: Hospice | Payer: Self-pay | Source: Home / Self Care | Attending: Neurology

## 2016-11-28 ENCOUNTER — Inpatient Hospital Stay (HOSPITAL_COMMUNITY): Payer: Commercial Managed Care - HMO

## 2016-11-28 ENCOUNTER — Emergency Department (HOSPITAL_COMMUNITY): Payer: Commercial Managed Care - HMO

## 2016-11-28 ENCOUNTER — Encounter (HOSPITAL_COMMUNITY): Payer: Self-pay | Admitting: Nurse Practitioner

## 2016-11-28 DIAGNOSIS — Z515 Encounter for palliative care: Secondary | ICD-10-CM | POA: Diagnosis not present

## 2016-11-28 DIAGNOSIS — I63412 Cerebral infarction due to embolism of left middle cerebral artery: Secondary | ICD-10-CM | POA: Diagnosis not present

## 2016-11-28 DIAGNOSIS — E785 Hyperlipidemia, unspecified: Secondary | ICD-10-CM | POA: Diagnosis not present

## 2016-11-28 DIAGNOSIS — Z888 Allergy status to other drugs, medicaments and biological substances status: Secondary | ICD-10-CM

## 2016-11-28 DIAGNOSIS — T85598A Other mechanical complication of other gastrointestinal prosthetic devices, implants and grafts, initial encounter: Secondary | ICD-10-CM

## 2016-11-28 DIAGNOSIS — G8191 Hemiplegia, unspecified affecting right dominant side: Secondary | ICD-10-CM | POA: Diagnosis present

## 2016-11-28 DIAGNOSIS — Z87891 Personal history of nicotine dependence: Secondary | ICD-10-CM

## 2016-11-28 DIAGNOSIS — N179 Acute kidney failure, unspecified: Secondary | ICD-10-CM | POA: Diagnosis not present

## 2016-11-28 DIAGNOSIS — I6300 Cerebral infarction due to thrombosis of unspecified precerebral artery: Secondary | ICD-10-CM

## 2016-11-28 DIAGNOSIS — F329 Major depressive disorder, single episode, unspecified: Secondary | ICD-10-CM | POA: Diagnosis present

## 2016-11-28 DIAGNOSIS — Z79899 Other long term (current) drug therapy: Secondary | ICD-10-CM | POA: Diagnosis not present

## 2016-11-28 DIAGNOSIS — G9340 Encephalopathy, unspecified: Secondary | ICD-10-CM | POA: Diagnosis not present

## 2016-11-28 DIAGNOSIS — F419 Anxiety disorder, unspecified: Secondary | ICD-10-CM | POA: Diagnosis present

## 2016-11-28 DIAGNOSIS — R911 Solitary pulmonary nodule: Secondary | ICD-10-CM | POA: Diagnosis present

## 2016-11-28 DIAGNOSIS — K219 Gastro-esophageal reflux disease without esophagitis: Secondary | ICD-10-CM | POA: Diagnosis present

## 2016-11-28 DIAGNOSIS — R0603 Acute respiratory distress: Secondary | ICD-10-CM | POA: Diagnosis not present

## 2016-11-28 DIAGNOSIS — R4701 Aphasia: Secondary | ICD-10-CM | POA: Diagnosis present

## 2016-11-28 DIAGNOSIS — Q251 Coarctation of aorta: Secondary | ICD-10-CM

## 2016-11-28 DIAGNOSIS — I63312 Cerebral infarction due to thrombosis of left middle cerebral artery: Secondary | ICD-10-CM | POA: Diagnosis not present

## 2016-11-28 DIAGNOSIS — I639 Cerebral infarction, unspecified: Secondary | ICD-10-CM | POA: Diagnosis present

## 2016-11-28 DIAGNOSIS — R29810 Facial weakness: Secondary | ICD-10-CM | POA: Diagnosis present

## 2016-11-28 DIAGNOSIS — I672 Cerebral atherosclerosis: Secondary | ICD-10-CM | POA: Diagnosis present

## 2016-11-28 DIAGNOSIS — Z66 Do not resuscitate: Secondary | ICD-10-CM | POA: Diagnosis present

## 2016-11-28 DIAGNOSIS — I739 Peripheral vascular disease, unspecified: Secondary | ICD-10-CM | POA: Diagnosis not present

## 2016-11-28 DIAGNOSIS — Z9289 Personal history of other medical treatment: Secondary | ICD-10-CM

## 2016-11-28 DIAGNOSIS — J988 Other specified respiratory disorders: Secondary | ICD-10-CM | POA: Diagnosis not present

## 2016-11-28 DIAGNOSIS — I10 Essential (primary) hypertension: Secondary | ICD-10-CM | POA: Diagnosis present

## 2016-11-28 DIAGNOSIS — R471 Dysarthria and anarthria: Secondary | ICD-10-CM | POA: Diagnosis present

## 2016-11-28 DIAGNOSIS — I6350 Cerebral infarction due to unspecified occlusion or stenosis of unspecified cerebral artery: Secondary | ICD-10-CM | POA: Diagnosis not present

## 2016-11-28 HISTORY — PX: RADIOLOGY WITH ANESTHESIA: SHX6223

## 2016-11-28 HISTORY — PX: IR GENERIC HISTORICAL: IMG1180011

## 2016-11-28 LAB — COMPREHENSIVE METABOLIC PANEL
ALBUMIN: 4.1 g/dL (ref 3.5–5.0)
ALT: 14 U/L — AB (ref 17–63)
AST: 26 U/L (ref 15–41)
Alkaline Phosphatase: 81 U/L (ref 38–126)
Anion gap: 12 (ref 5–15)
BUN: 20 mg/dL (ref 6–20)
CHLORIDE: 100 mmol/L — AB (ref 101–111)
CO2: 25 mmol/L (ref 22–32)
CREATININE: 1.49 mg/dL — AB (ref 0.61–1.24)
Calcium: 9.4 mg/dL (ref 8.9–10.3)
GFR calc non Af Amer: 43 mL/min — ABNORMAL LOW (ref >60)
GFR, EST AFRICAN AMERICAN: 49 mL/min — AB (ref >60)
GLUCOSE: 98 mg/dL (ref 65–99)
Potassium: 4.5 mmol/L (ref 3.5–5.1)
Sodium: 137 mmol/L (ref 135–145)
Total Bilirubin: 0.5 mg/dL (ref 0.3–1.2)
Total Protein: 7.2 g/dL (ref 6.5–8.1)

## 2016-11-28 LAB — I-STAT CHEM 8, ED
BUN: 25 mg/dL — ABNORMAL HIGH (ref 6–20)
CREATININE: 1.4 mg/dL — AB (ref 0.61–1.24)
Calcium, Ion: 0.95 mmol/L — ABNORMAL LOW (ref 1.15–1.40)
Chloride: 101 mmol/L (ref 101–111)
Glucose, Bld: 102 mg/dL — ABNORMAL HIGH (ref 65–99)
HEMATOCRIT: 51 % (ref 39.0–52.0)
HEMOGLOBIN: 17.3 g/dL — AB (ref 13.0–17.0)
POTASSIUM: 4.2 mmol/L (ref 3.5–5.1)
Sodium: 136 mmol/L (ref 135–145)
TCO2: 24 mmol/L (ref 0–100)

## 2016-11-28 LAB — DIFFERENTIAL
BASOS ABS: 0.1 10*3/uL (ref 0.0–0.1)
BASOS PCT: 1 %
Eosinophils Absolute: 0.4 10*3/uL (ref 0.0–0.7)
Eosinophils Relative: 3 %
Lymphocytes Relative: 16 %
Lymphs Abs: 2 10*3/uL (ref 0.7–4.0)
Monocytes Absolute: 0.5 10*3/uL (ref 0.1–1.0)
Monocytes Relative: 4 %
NEUTROS ABS: 9 10*3/uL — AB (ref 1.7–7.7)
Neutrophils Relative %: 76 %

## 2016-11-28 LAB — CBC
HCT: 50.7 % (ref 39.0–52.0)
Hemoglobin: 16.8 g/dL (ref 13.0–17.0)
MCH: 30.4 pg (ref 26.0–34.0)
MCHC: 33.1 g/dL (ref 30.0–36.0)
MCV: 91.7 fL (ref 78.0–100.0)
PLATELETS: 400 10*3/uL (ref 150–400)
RBC: 5.53 MIL/uL (ref 4.22–5.81)
RDW: 15.6 % — AB (ref 11.5–15.5)
WBC: 12 10*3/uL — AB (ref 4.0–10.5)

## 2016-11-28 LAB — PROTIME-INR
INR: 1.04
PROTHROMBIN TIME: 13.7 s (ref 11.4–15.2)

## 2016-11-28 LAB — APTT: APTT: 39 s — AB (ref 24–36)

## 2016-11-28 LAB — I-STAT TROPONIN, ED: Troponin i, poc: 0 ng/mL (ref 0.00–0.08)

## 2016-11-28 LAB — ETHANOL: Alcohol, Ethyl (B): <5 mg/dL (ref <5)

## 2016-11-28 LAB — CBG MONITORING, ED: Glucose-Capillary: 97 mg/dL (ref 65–99)

## 2016-11-28 SURGERY — RADIOLOGY WITH ANESTHESIA
Anesthesia: General

## 2016-11-28 MED ORDER — LACTATED RINGERS IV SOLN
INTRAVENOUS | Status: DC | PRN
  Administered 2016-11-28 (×2): via INTRAVENOUS

## 2016-11-28 MED ORDER — SODIUM CHLORIDE 0.9 % IV SOLN
INTRAVENOUS | Status: DC
  Administered 2016-11-29 – 2016-11-30 (×2): via INTRAVENOUS

## 2016-11-28 MED ORDER — STROKE: EARLY STAGES OF RECOVERY BOOK
Freq: Once | Status: AC
  Administered 2016-11-28: 23:00:00
  Filled 2016-11-28: qty 1

## 2016-11-28 MED ORDER — PROPOFOL 500 MG/50ML IV EMUL
INTRAVENOUS | Status: DC | PRN
  Administered 2016-11-28: 60 ug/kg/min via INTRAVENOUS

## 2016-11-28 MED ORDER — ACETAMINOPHEN 325 MG PO TABS: 650.0000 mg | ORAL_TABLET | ORAL | Status: DC | PRN

## 2016-11-28 MED ORDER — FENTANYL CITRATE (PF) 100 MCG/2ML IJ SOLN
50.0000 ug | INTRAMUSCULAR | Status: DC | PRN
  Administered 2016-11-29: 50 ug via INTRAVENOUS

## 2016-11-28 MED ORDER — ACETAMINOPHEN 160 MG/5ML PO SOLN
650.0000 mg | ORAL | Status: DC | PRN
  Administered 2016-11-30: 650 mg
  Filled 2016-11-28: qty 20.3

## 2016-11-28 MED ORDER — SUCCINYLCHOLINE CHLORIDE 20 MG/ML IJ SOLN
INTRAMUSCULAR | Status: DC | PRN
  Administered 2016-11-28: 60 mg via INTRAVENOUS

## 2016-11-28 MED ORDER — ONDANSETRON HCL 4 MG/2ML IJ SOLN: 4.0000 mg | Freq: Four times a day (QID) | INTRAMUSCULAR | Status: DC | PRN

## 2016-11-28 MED ORDER — FENTANYL CITRATE (PF) 100 MCG/2ML IJ SOLN
INTRAMUSCULAR | Status: DC | PRN
  Administered 2016-11-28: 50 ug via INTRAVENOUS
  Administered 2016-11-28: 100 ug via INTRAVENOUS

## 2016-11-28 MED ORDER — FENTANYL CITRATE (PF) 100 MCG/2ML IJ SOLN
50.0000 ug | INTRAMUSCULAR | Status: DC | PRN
  Administered 2016-11-29: 50 ug via INTRAVENOUS
  Filled 2016-11-28 (×2): qty 2

## 2016-11-28 MED ORDER — ACETAMINOPHEN 160 MG/5ML PO SOLN: 650.0000 mg | ORAL | Status: DC | PRN

## 2016-11-28 MED ORDER — PROPOFOL 10 MG/ML IV BOLUS
INTRAVENOUS | Status: DC | PRN
  Administered 2016-11-28: 60 mg via INTRAVENOUS
  Administered 2016-11-28: 40 mg via INTRAVENOUS

## 2016-11-28 MED ORDER — CEFAZOLIN SODIUM-DEXTROSE 2-3 GM-% IV SOLR
INTRAVENOUS | Status: DC | PRN
  Administered 2016-11-28: 2 g via INTRAVENOUS

## 2016-11-28 MED ORDER — NITROGLYCERIN 1 MG/10 ML FOR IR/CATH LAB
INTRA_ARTERIAL | Status: AC
  Administered 2016-11-28 (×2): 25 ug
  Filled 2016-11-28: qty 10

## 2016-11-28 MED ORDER — IOPAMIDOL (ISOVUE-370) INJECTION 76%
INTRAVENOUS | Status: AC
  Administered 2016-11-28: 90 mL
  Filled 2016-11-28: qty 100

## 2016-11-28 MED ORDER — EPTIFIBATIDE 20 MG/10ML IV SOLN
INTRAVENOUS | Status: AC
  Administered 2016-11-28: 1.5 mg
  Administered 2016-11-28 (×3): 1.8 mg
  Filled 2016-11-28: qty 10

## 2016-11-28 MED ORDER — LACTATED RINGERS IV SOLN
INTRAVENOUS | Status: DC | PRN
  Administered 2016-11-28: 20:00:00 via INTRAVENOUS

## 2016-11-28 MED ORDER — IOPAMIDOL (ISOVUE-300) INJECTION 61%
INTRAVENOUS | Status: AC
  Administered 2016-11-28: 125 mL
  Filled 2016-11-28: qty 300

## 2016-11-28 MED ORDER — CEFAZOLIN SODIUM-DEXTROSE 2-4 GM/100ML-% IV SOLN
INTRAVENOUS | Status: AC
  Filled 2016-11-28: qty 100

## 2016-11-28 MED ORDER — ACETAMINOPHEN 650 MG RE SUPP: 650.0000 mg | RECTAL | Status: DC | PRN

## 2016-11-28 MED ORDER — SODIUM CHLORIDE 0.9 % IV SOLN
INTRAVENOUS | Status: DC
Start: 2016-11-28 — End: 2016-11-30

## 2016-11-28 MED ORDER — PROPOFOL 1000 MG/100ML IV EMUL
INTRAVENOUS | Status: AC
  Filled 2016-11-28: qty 100

## 2016-11-28 MED ORDER — PHENYLEPHRINE HCL 10 MG/ML IJ SOLN
INTRAVENOUS | Status: DC | PRN
  Administered 2016-11-28: 20 ug/min via INTRAVENOUS

## 2016-11-28 MED ORDER — HEPARIN SODIUM (PORCINE) 1000 UNIT/ML IJ SOLN
INTRAMUSCULAR | Status: DC | PRN
  Administered 2016-11-28 (×2): 1000 [IU] via INTRAVENOUS

## 2016-11-28 MED ORDER — PHENYLEPHRINE HCL 10 MG/ML IJ SOLN
INTRAMUSCULAR | Status: DC | PRN
  Administered 2016-11-28 (×2): 80 ug via INTRAVENOUS

## 2016-11-28 MED ORDER — ROCURONIUM BROMIDE 100 MG/10ML IV SOLN
INTRAVENOUS | Status: DC | PRN
  Administered 2016-11-28: 20 mg via INTRAVENOUS
  Administered 2016-11-28: 50 mg via INTRAVENOUS

## 2016-11-28 MED ORDER — NICARDIPINE HCL IN NACL 20-0.86 MG/200ML-% IV SOLN
5.0000 mg/h | INTRAVENOUS | Status: DC
  Administered 2016-11-28: 5 mg/h via INTRAVENOUS
  Administered 2016-11-29: 10 mg/h via INTRAVENOUS
  Administered 2016-11-29: 8 mg/h via INTRAVENOUS
  Administered 2016-11-29: 10 mg/h via INTRAVENOUS
  Administered 2016-11-29: 7.5 mg/h via INTRAVENOUS
  Administered 2016-11-29: 12.5 mg/h via INTRAVENOUS
  Administered 2016-11-29: 10 mg/h via INTRAVENOUS
  Administered 2016-11-29: 5 mg/h via INTRAVENOUS
  Administered 2016-11-30: 4 mg/h via INTRAVENOUS
  Filled 2016-11-28 (×9): qty 200

## 2016-11-28 NOTE — Consult Note (Signed)
PULMONARY / CRITICAL CARE MEDICINE   Name: [name] MRN: [id number] DOB: [birth year]    ADMISSION DATE:  11/28/2016 CONSULTATION DATE:  11/28/2016  REFERRING MD:  DR. Estanislado Pandy  CHIEF COMPLAINT:  CVA  HISTORY OF PRESENT ILLNESS:  Patient is encephalopathic and/or intubated. Therefore history has been obtained from chart review.  82 year old male presented to ED 12/11 with complaints of apashia. Per family he was speaking nonsense. Upon arrival to the ED he was found to have facial droop, aphasia and dysarthria. CT perfusion demonstrated large penumbra with M2 occlusion. He was outside tpa window and was taken to IR for revascularization, which was done successfully. Post operatively he was taken to ICU on ventilator for recovery. PCCM to see for vent management.    PAST MEDICAL HISTORY :  He  has a past medical history of Complication of anesthesia; Depression; Dysrhythmia; Erectile dysfunction; GERD (gastroesophageal reflux disease); and PAD (peripheral artery disease) ([place]).  PAST SURGICAL HISTORY: He  has a past surgical history that includes Appendectomy; Eye surgery; Endarterectomy (Left, 03/23/2016); and Patch angioplasty (Left, 03/23/2016).  Allergies  Allergen Reactions  . Other Other (See Comments)    Aspertame, Artificial Sweetner, causes hallucinations  . Valium [Diazepam] Other (See Comments)    Makes crazy per pt Also causes amnesia, per pt, hallucinations, paranoid,  Pt. & family told that he was given for the angiogram- (valium)- experienced failure to thrive, confused, irrational behavior, pt. Went to [facility]"      No current facility-administered medications on file prior to encounter.    Current Outpatient Prescriptions on File Prior to Encounter  Medication Sig  . Biotin 5000 MCG CAPS Take 2,500 mcg by mouth daily.   . Chlorpheniramine-DM 4-30 MG TABS Take 1 tablet by mouth daily as needed (for cold symptoms).  . Methylfol-Methylcob-Acetylcyst (METAFOLBIC  PLUS) 6-2-600 MG TABS Take 1 tablet by mouth daily. (Patient taking differently: Take 1 tablet by mouth daily. Pt. Is taking for food supplement for memory problem)  . metoprolol tartrate (LOPRESSOR) 25 MG tablet Take 1 tablet (25 mg total) by mouth 2 (two) times daily. (Patient not taking: Reported on 04/19/2016)  . Omega-3 Fatty Acids ([employer] FISH OIL) 1200 MG CAPS Take 1,000 mg by mouth once a week.   Marland Kitchen oxyCODONE-acetaminophen (PERCOCET/ROXICET) 5-325 MG tablet Take 1-2 tablets by mouth every 4 (four) hours as needed for moderate pain. (Patient not taking: Reported on 04/19/2016)  . Probiotic Product (PROBIOTIC DAILY PO) Take 1 tablet by mouth daily.   . vitamin A 10000 UNIT capsule Take 10,000 Units by mouth daily.  . vitamin C (ASCORBIC ACID) 500 MG tablet Take 500 mg by mouth daily.    FAMILY HISTORY:  His indicated that his mother is deceased. He indicated that his father is deceased.    SOCIAL HISTORY: He  reports that he quit smoking about 7 years ago. He has never used smokeless tobacco. He reports that he does not drink alcohol.  REVIEW OF SYSTEMS:   unable  SUBJECTIVE:  unable  VITAL SIGNS: BP 152/70   Pulse 80   Temp 97.7 F (36.5 C) (Oral)   Resp 20   Ht 5' 11.26" (1.81 m)   Wt 73.9 kg (162 lb 14.7 oz)   SpO2 100%   BMI 22.56 kg/m   HEMODYNAMICS:    VENTILATOR SETTINGS: Vent Mode: PRVC FiO2 (%):  [100 %] 100 % Set Rate:  [14 bmp] 14 bmp Vt Set:  [600 mL] 600 mL PEEP:  [5  cmH20] 5 cmH20 Plateau Pressure:  [15 cmH20] 15 cmH20  INTAKE / OUTPUT: No intake/output data recorded.  PHYSICAL EXAMINATION: General:  Elderly male on vent in NAD Neuro:  sedated HEENT:  [place]/AT, PERRL, no JVD Cardiovascular:  RRR, no MRG Lungs:  Clear Abdomen:  Soft, non-distended Musculoskeletal:  No acute deformity Skin:  Grossly intact  LABS:  BMET  Recent Labs Lab 11/28/16 1800 11/28/16 1810  NA 137 136  K 4.5 4.2  CL 100* 101  CO2 25  --   BUN 20 25*  CREATININE  1.49* 1.40*  GLUCOSE 98 102*    Electrolytes  Recent Labs Lab 11/28/16 1800  CALCIUM 9.4    CBC  Recent Labs Lab 11/28/16 1800 11/28/16 1810  WBC 12.0*  --   HGB 16.8 17.3*  HCT 50.7 51.0  PLT 400  --     Coag's  Recent Labs Lab 11/28/16 1800  APTT 39*  INR 1.04    Sepsis Markers No results for input(s): LATICACIDVEN, PROCALCITON, O2SATVEN in the last 168 hours.  ABG No results for input(s): PHART, PCO2ART, PO2ART in the last 168 hours.  Liver Enzymes  Recent Labs Lab 11/28/16 1800  AST 26  ALT 14*  ALKPHOS 81  BILITOT 0.5  ALBUMIN 4.1    Cardiac Enzymes No results for input(s): TROPONINI, PROBNP in the last 168 hours.  Glucose  Recent Labs Lab 11/28/16 1859  GLUCAP 97    Imaging Ct Angio Head W Or Wo Contrast  Result Date: 11/28/2016 CLINICAL DATA:  Slurred speech EXAM: CT PERFUSION BRAIN CT angiography head and neck TECHNIQUE: Multiphase CT imaging of the brain was performed following IV bolus contrast injection. Subsequent parametric perfusion maps were calculated using RAPID software. CT angiography of the head and neck were also performed. Stenosis measurements, as reported, are calculated using the NASCET criteria. CONTRAST:  90 mL Isovue 370 IV her COMPARISON:  Head CT 11/28/2016 and 09/12/2011 FINDINGS: CTA NECK FINDINGS Aortic arch: There is atherosclerotic calcification of the aortic arch with mild-to-moderate narrowing at the arch vessel origins. Proximal subclavian arteries are patent. There is a normal 3 vessel arch configuration. The visualized proximal subclavian arteries are normal. Right carotid system: There is partially calcified plaque at the right carotid bifurcation without hemodynamically significant stenosis. Left carotid system: There is predominantly noncalcified plaque at the proximal left common carotid artery and within the distal left common carotid artery, resulting in approximately 25% narrowing of the arterial lumen.  The left external carotid artery is occluded near its origin, with distal reconstitution via collateral pathways. There is predominantly noncalcified plaque along most of the course of the left internal carotid artery resulting in stenosis that measures up to approximately 50%. Vertebral arteries: The vertebral system is left dominant. There is moderate narrowing of the left vertebral artery origin secondary to atherosclerotic plaque. The right vertebral artery is occluded at its origin and along its entire cervical length. Course and caliber of the left vertebral artery are normal to the junction with the basilar artery. There is minimal opacification of the right V4 segment secondary to collateral flow. Skeleton: There is no bony spinal canal stenosis. No lytic or blastic lesions. Other neck: The nasopharynx is clear. The oropharynx and hypopharynx are normal. The epiglottis is normal. The supraglottic larynx, glottis and subglottic larynx are normal. No retropharyngeal collection. The parapharyngeal spaces are preserved. The parotid and submandibular glands are normal. No sialolithiasis or salivary ductal dilatation. The thyroid gland is normal. There is no cervical  lymphadenopathy. Upper chest: Biapical centrilobular emphysema.  No pleural effusion. Review of the MIP images confirms the above findings CTA HEAD FINDINGS Anterior circulation: --Intracranial internal carotid arteries: There is atherosclerotic calcification of both intracranial internal carotid artery is without severe stenosis. --Anterior cerebral arteries: Normal. --Middle cerebral arteries: There is occlusion of the left MCA M2 superior division. The right MCA is normal. --Posterior communicating arteries: Present on the right. Posterior circulation: --Posterior cerebral arteries: Normal. --Superior cerebellar arteries: Normal. --Basilar artery: Normal. --Anterior inferior cerebellar arteries: Normal. --Posterior inferior cerebellar arteries:  Normal. Venous sinuses: As permitted by contrast timing, patent. Anatomic variants: Predominantly fetal origin of the right posterior cerebral artery. Delayed phase: Not performed Review of the MIP images confirms the above findings CT Brain Perfusion Findings: CBF (<30%) Volume: 57mL Perfusion (Tmax>6.0s) volume: 222mL Mismatch Volume: 22mL Infarction Location:Left MCA distribution IMPRESSION: 1. Perfusion abnormalities consistent with left MCA infarct with mismatch volume measuring 235 mL. 2. 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 3. 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 Posterior cerebral circulation remains patent. 4. Left carotid system atherosclerotic disease resulting and mild common carotid stenosis, proximal occlusion of the external carotid artery and approximately 50% narrowing of the left internal carotid artery. Critical Value/emergent results were called by telephone at the time of interpretation on 11/28/2016 at 6:35 pm to Dr. Roland Rack , who verbally acknowledged these results. Electronically Signed   By: Ulyses Jarred M.D.   On: 11/28/2016 18:59   Ct Angio Neck W And/or Wo Contrast  Result Date: 11/28/2016 CLINICAL DATA:  Slurred speech EXAM: CT PERFUSION BRAIN CT angiography head and neck TECHNIQUE: Multiphase CT imaging of the brain was performed following IV bolus contrast injection. Subsequent parametric perfusion maps were calculated using RAPID software. CT angiography of the head and neck were also performed. Stenosis measurements, as reported, are calculated using the NASCET criteria. CONTRAST:  90 mL Isovue 370 IV her COMPARISON:  Head CT 11/28/2016 and 09/12/2011 FINDINGS: CTA NECK FINDINGS Aortic arch: There is atherosclerotic calcification of the aortic arch with mild-to-moderate narrowing at the arch vessel origins. Proximal subclavian arteries are patent. There is a normal 3 vessel arch  configuration. The visualized proximal subclavian arteries are normal. Right carotid system: There is partially calcified plaque at the right carotid bifurcation without hemodynamically significant stenosis. Left carotid system: There is predominantly noncalcified plaque at the proximal left common carotid artery and within the distal left common carotid artery, resulting in approximately 25% narrowing of the arterial lumen. The left external carotid artery is occluded near its origin, with distal reconstitution via collateral pathways. There is predominantly noncalcified plaque along most of the course of the left internal carotid artery resulting in stenosis that measures up to approximately 50%. Vertebral arteries: The vertebral system is left dominant. There is moderate narrowing of the left vertebral artery origin secondary to atherosclerotic plaque. The right vertebral artery is occluded at its origin and along its entire cervical length. Course and caliber of the left vertebral artery are normal to the junction with the basilar artery. There is minimal opacification of the right V4 segment secondary to collateral flow. Skeleton: There is no bony spinal canal stenosis. No lytic or blastic lesions. Other neck: The nasopharynx is clear. The oropharynx and hypopharynx are normal. The epiglottis is normal. The supraglottic larynx, glottis and subglottic larynx are normal. No retropharyngeal collection. The parapharyngeal spaces are preserved. The parotid and submandibular glands are normal. No  sialolithiasis or salivary ductal dilatation. The thyroid gland is normal. There is no cervical lymphadenopathy. Upper chest: Biapical centrilobular emphysema.  No pleural effusion. Review of the MIP images confirms the above findings CTA HEAD FINDINGS Anterior circulation: --Intracranial internal carotid arteries: There is atherosclerotic calcification of both intracranial internal carotid artery is without severe stenosis.  --Anterior cerebral arteries: Normal. --Middle cerebral arteries: There is occlusion of the left MCA M2 superior division. The right MCA is normal. --Posterior communicating arteries: Present on the right. Posterior circulation: --Posterior cerebral arteries: Normal. --Superior cerebellar arteries: Normal. --Basilar artery: Normal. --Anterior inferior cerebellar arteries: Normal. --Posterior inferior cerebellar arteries: Normal. Venous sinuses: As permitted by contrast timing, patent. Anatomic variants: Predominantly fetal origin of the right posterior cerebral artery. Delayed phase: Not performed Review of the MIP images confirms the above findings CT Brain Perfusion Findings: CBF (<30%) Volume: 67mL Perfusion (Tmax>6.0s) volume: 253mL Mismatch Volume: 268mL Infarction Location:Left MCA distribution IMPRESSION: 1. Perfusion abnormalities consistent with left MCA infarct with mismatch volume measuring 235 mL. 2. 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 3. 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 Posterior cerebral circulation remains patent. 4. Left carotid system atherosclerotic disease resulting and mild common carotid stenosis, proximal occlusion of the external carotid artery and approximately 50% narrowing of the left internal carotid artery. Critical Value/emergent results were called by telephone at the time of interpretation on 11/28/2016 at 6:35 pm to Dr. Roland Rack , who verbally acknowledged these results. Electronically Signed   By: Ulyses Jarred M.D.   On: 11/28/2016 18:59   Ct Cerebral Perfusion W Contrast  Result Date: 11/28/2016 CLINICAL DATA:  Slurred speech EXAM: CT PERFUSION BRAIN CT angiography head and neck TECHNIQUE: Multiphase CT imaging of the brain was performed following IV bolus contrast injection. Subsequent parametric perfusion maps were calculated using RAPID software. CT angiography of the head and  neck were also performed. Stenosis measurements, as reported, are calculated using the NASCET criteria. CONTRAST:  90 mL Isovue 370 IV her COMPARISON:  Head CT 11/28/2016 and 09/12/2011 FINDINGS: CTA NECK FINDINGS Aortic arch: There is atherosclerotic calcification of the aortic arch with mild-to-moderate narrowing at the arch vessel origins. Proximal subclavian arteries are patent. There is a normal 3 vessel arch configuration. The visualized proximal subclavian arteries are normal. Right carotid system: There is partially calcified plaque at the right carotid bifurcation without hemodynamically significant stenosis. Left carotid system: There is predominantly noncalcified plaque at the proximal left common carotid artery and within the distal left common carotid artery, resulting in approximately 25% narrowing of the arterial lumen. The left external carotid artery is occluded near its origin, with distal reconstitution via collateral pathways. There is predominantly noncalcified plaque along most of the course of the left internal carotid artery resulting in stenosis that measures up to approximately 50%. Vertebral arteries: The vertebral system is left dominant. There is moderate narrowing of the left vertebral artery origin secondary to atherosclerotic plaque. The right vertebral artery is occluded at its origin and along its entire cervical length. Course and caliber of the left vertebral artery are normal to the junction with the basilar artery. There is minimal opacification of the right V4 segment secondary to collateral flow. Skeleton: There is no bony spinal canal stenosis. No lytic or blastic lesions. Other neck: The nasopharynx is clear. The oropharynx and hypopharynx are normal. The epiglottis is normal. The supraglottic larynx, glottis and subglottic larynx are normal. No retropharyngeal collection. The  parapharyngeal spaces are preserved. The parotid and submandibular glands are normal. No  sialolithiasis or salivary ductal dilatation. The thyroid gland is normal. There is no cervical lymphadenopathy. Upper chest: Biapical centrilobular emphysema.  No pleural effusion. Review of the MIP images confirms the above findings CTA HEAD FINDINGS Anterior circulation: --Intracranial internal carotid arteries: There is atherosclerotic calcification of both intracranial internal carotid artery is without severe stenosis. --Anterior cerebral arteries: Normal. --Middle cerebral arteries: There is occlusion of the left MCA M2 superior division. The right MCA is normal. --Posterior communicating arteries: Present on the right. Posterior circulation: --Posterior cerebral arteries: Normal. --Superior cerebellar arteries: Normal. --Basilar artery: Normal. --Anterior inferior cerebellar arteries: Normal. --Posterior inferior cerebellar arteries: Normal. Venous sinuses: As permitted by contrast timing, patent. Anatomic variants: Predominantly fetal origin of the right posterior cerebral artery. Delayed phase: Not performed Review of the MIP images confirms the above findings CT Brain Perfusion Findings: CBF (<30%) Volume: 31mL Perfusion (Tmax>6.0s) volume: 26mL Mismatch Volume: 211mL Infarction Location:Left MCA distribution IMPRESSION: 1. Perfusion abnormalities consistent with left MCA infarct with mismatch volume measuring 235 mL. 2. 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 3. 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 Posterior cerebral circulation remains patent. 4. Left carotid system atherosclerotic disease resulting and mild common carotid stenosis, proximal occlusion of the external carotid artery and approximately 50% narrowing of the left internal carotid artery. Critical Value/emergent results were called by telephone at the time of interpretation on 11/28/2016 at 6:35 pm to Dr. Roland Rack , who verbally acknowledged these  results. Electronically Signed   By: Ulyses Jarred M.D.   On: 11/28/2016 18:59   Ct Head Code Stroke Wo Contrast`  Addendum Date: 11/28/2016   ADDENDUM REPORT: 11/28/2016 23:11 ADDENDUM: Though findings could represent contrast staining, this is an atypical appearance for said entity. Acute findings discussed with and reconfirmed by Dr.LINDZEN on 11/28/2016 at 11:00 pm. Electronically Signed   By: Elon Alas M.D.   On: 11/28/2016 23:11   Result Date: 11/28/2016 CLINICAL DATA:  Code stroke. Status post revascularization and clot retrieval LEFT MCA. 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 EXAM: CT HEAD WITHOUT CONTRAST TECHNIQUE: Contiguous axial images were obtained from the base of the skull through the vertex without intravenous contrast. COMPARISON:  None. FINDINGS: BRAIN: Intravascular contrast limits evaluation of intracranial hemorrhage. No lobar hematoma, midline shift or mass effect. Faint enhancement LEFT temporal lobe. Patchy to confluent supratentorial white matter hypodensities. Old bilateral basal ganglia lacunar infarcts. No abnormal extra-axial fluid collections. Basal cisterns are patent. VASCULAR: Moderate calcific atherosclerosis of the carotid siphons. Contrast opacifies LEFT middle cerebral artery. SKULL: No skull fracture. LEFT sphenoid intraosseous lipoma. Probable bone island RIGHT superolateral orbital wall. No significant scalp soft tissue swelling. SINUSES/ORBITS: Trace LEFT mastoid effusion. Mild paranasal sinus mucosal thickening without air-fluid levels. The included ocular globes and orbital contents are non-suspicious. OTHER: Patient is edentulous.  Life-support lines in place. ASPECTS [name] Stroke Program Early CT Score) - Ganglionic level infarction (caudate, lentiform nuclei, internal capsule, insula, M1-M3 cortex): 7 - Supraganglionic infarction (M4-M6 cortex): 3 Total score (0-10 with 10 being normal): 10 IMPRESSION: 1. Status post  endovascular revascularization LEFT MCA, faint enhancing LEFT temporal lobe consistent with collateral vessels in the setting of acute stroke, or, can be seen in subacute infarcts. Moderate to severe chronic small vessel ischemic disease, old bilateral basal ganglia lacunar infarcts. 2. ASPECTS is 10 though, limited by presence of  intravascular contrast. Referring clinician being paged by the Radiology assistant, pending return call. Electronically Signed: By: Elon Alas M.D. On: 11/28/2016 22:47   Ct Head Code Stroke Wo Contrast`  Result Date: 11/28/2016 CLINICAL DATA:  Code stroke.  Slurred speech EXAM: CT HEAD WITHOUT CONTRAST TECHNIQUE: Contiguous axial images were obtained from the base of the skull through the vertex without intravenous contrast. COMPARISON:  Head CT 09/12/2011 FINDINGS: Brain: No mass lesion, intraparenchymal hemorrhage or extra-axial collection. No evidence of acute cortical infarct. There is loss encephalomalacia in the right parietal and occipital lobes, compatible with prior infarcts. There is an old right lenticular capsular lacunar infarct. There is periventricular hypoattenuation compatible with chronic microvascular disease. Vascular: Mild calcification of the internal carotid arteries at the skullbase. Skull: Normal visualized skull base, calvarium and extracranial soft tissues. Sinuses/Orbits: Partial opacification of the left mastoid air cells. Normal orbits. ASPECTS [name] Stroke Program Early CT Score) - Ganglionic level infarction (caudate, lentiform nuclei, internal capsule, insula, M1-M3 cortex): 7 - Supraganglionic infarction (M4-M6 cortex): 3 Total score (0-10 with 10 being normal): 10 IMPRESSION: 1. No acute intracranial hemorrhage. Findings of multiple old infarcts and chronic microvascular ischemia. 2. ASPECTS is 10. These results were called by telephone at the time of interpretation on 11/28/2016 at 6:21 pm to Dr. Kathrynn Speed , who verbally  acknowledged these results. Electronically Signed   By: Ulyses Jarred M.D.   On: 11/28/2016 18:22     STUDIES:  CTA head 12/11 > Perfusion abnormalities consistent with left MCA infarct with mismatch volume measuring 235 mL. 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 Posterior cerebral circulation remains patent. Left carotid system atherosclerotic disease resulting and mild common carotid stenosis, proximal occlusion of the external carotid artery and approximately 50% narrowing of the left internal carotid artery.  CULTURES:   ANTIBIOTICS:   SIGNIFICANT EVENTS:   LINES/TUBES: Art 12/11 > ETT 12/11 >  DISCUSSION:   ASSESSMENT / PLAN:  PULMONARY A: Inability to protect airway post op setting  P:   Full vent SBT in AM Vent bundle  CARDIOVASCULAR A:  Tight BP control needed H/o HTN  P:  PRN nicardipine to keep SBP 120-140 Tele monitoring Hold home metoprolol  RENAL A:   AKI  P:   Gentle hydration Follow BMP  GASTROINTESTINAL A:   No acute isseus  P:   Follow BMP  HEMATOLOGIC A:   No acute issues  P:  Follow CBC Anticoag per neuro  INFECTIOUS A:   No acute issues  P:   monitor  ENDOCRINE A:   No acute issues    P:   Monitor glucose on BMP  NEUROLOGIC A:   Lt MCA CVA s/p IR revascularization  P:   RASS goal: -1 to -2 Propofol for sedation Neuro checks Neurology primary    FAMILY  - Updates: no family avail  - Inter-disciplinary family meet or Palliative Care meeting due by:  12/18    Georgann Housekeeper, AGACNP-BC [place] [facility] Pager [phone] or [phone]  11/29/2016 12:40 AM    STAFF NOTE: Linwood Dibbles, MD FACP have personally reviewed patient's available data, including medical history, events of note, physical examination and test results as part of my evaluation. I have  discussed with resident/NP and other care providers such as pharmacist, RN and RRT. In addition, I personally evaluated patient and elicited key findings of: sedated, thin male,  lumgs clear, moving left arm, repeat CT after revasc reviewed, has aline, nicardipine was required but is now off and he remain on propofol and is in BP range, scd, get abg on current MV, pcxr reviewed and needs repeat with ett now, keep npo, ensure ppi or pepcid, follow cbg, will likely wean aggressiv ein am pending neuro assessment The patient is critically ill with multiple organ systems failure and requires high complexity decision making for assessment and support, frequent evaluation and titration of therapies, application of advanced monitoring technologies and extensive interpretation of multiple databases.   Critical Care Time devoted to patient care services described in this note is 30 Minutes. This time reflects time of care of this signee: Merrie Roof, MD FACP. This critical care time does not reflect procedure time, or teaching time or supervisory time of PA/NP/Med student/Med Resident etc but could involve care discussion time. Rest per NP/medical resident whose note is outlined above and that I agree with  Services provided on 12/11  Lavon Paganini. Titus Mould, MD, [facility] 11/29/2016 1:13 AM

## 2016-11-28 NOTE — ED Notes (Signed)
Patient groin shaved, foley catheter placed by [facility] and Left pedal and tibial pulse marked and right tibial pulse marked. Femoral pulses 2+ bilaterally. Pt denies pain.

## 2016-11-28 NOTE — Transfer of Care (Signed)
Immediate Anesthesia Transfer of Care Note  Patient: [name]  Procedure(s) Performed: Procedure(s): RADIOLOGY WITH ANESTHESIA (N/A)  Patient Location: PACU  Anesthesia Type:General  Level of Consciousness: Patient remains intubated per anesthesia plan  Airway & Oxygen Therapy: Patient remains intubated per anesthesia plan and Patient placed on Ventilator (see vital sign flow sheet for setting)  Post-op Assessment: Report given to RN and Post -op Vital signs reviewed and stable  Post vital signs: Reviewed and stable  Last Vitals:  Vitals:   11/28/16 1920 11/28/16 2217  BP: (!) 207/121 152/70  Pulse: 88 80  Resp: 20 20  Temp:      Last Pain:  Vitals:   11/28/16 1836  TempSrc: Oral         Complications: No apparent anesthesia complications

## 2016-11-28 NOTE — Progress Notes (Signed)
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 To ICU on MV. CXR reviewed, ETT [name]  eICU Interventions  - MV orders placed - sedation orders placed.  - PCCM to eval at bedside this pm     Intervention Category Major Interventions: Respiratory failure - evaluation and management  [name] S. 11/28/2016, 11:54 PM

## 2016-11-28 NOTE — ED Provider Notes (Signed)
[facility] DEPT Provider Note   CSN: [id number] Arrival date & time: 11/28/16  1808     History   Chief Complaint Chief Complaint  Patient presents with  . Code Stroke    HPI [name] is a 82 y.o. male.  HPI Patient is an 82 year old male with past history significant for carotid endarterectomy who presents with a seizure. Patient was last normal around 1 PM. Family subsequently noticed that he was speaking nonsense. He was also noted to have some facial droop along with aphasia and dysarthria. History limited due to patient's difficulty speaking on arrival. Code stroke was initiated prior to patient arrival.  Past Medical History:  Diagnosis Date  . Complication of anesthesia    cognitive dysfunction > 6 months after a-gram with Versed/Fentanyl '12. Family attributes to Versed. Request that BZDs be avoided.  . Depression   . Dysrhythmia    has had Metoprolol recommended, mail order has not delivered it yet.    . Erectile dysfunction   . GERD (gastroesophageal reflux disease)   . PAD (peripheral artery disease) [facility])     Patient Active Problem List   Diagnosis Date Noted  . Coarctation of aorta, recurrent, post-intervention   . History of ETT   . Cerebrovascular accident (CVA) ([place]) 11/28/2016  . Carotid stenosis, asymptomatic 04/19/2016  . Carotid stenosis 03/08/2016  . Dizziness 01/26/2016  . Asymptomatic stenosis of left carotid artery 01/26/2016  . Left carotid bruit 04/21/2015  . Bruising 04/21/2015  . Mild cognitive impairment 03/25/2013  . PULMONARY NODULE 07/29/2010  . HYPERCHOLESTEROLEMIA 07/28/2010  . ANXIETY 07/28/2010  . DEPRESSION 07/28/2010  . HYPERTENSION 07/28/2010  . Peripheral vascular disease ([place]) 07/28/2010  . GERD 07/28/2010  . 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 APPENDECTOMY    . ENDARTERECTOMY Left 03/23/2016   Procedure: ENDARTERECTOMY LEFT CAROTID ARTERY;   Surgeon: Mal Misty, MD;  Location: [facility];  Service: Vascular;  Laterality: Left;  . EYE SURGERY     cataracts- /w IOL, bilateral   . PATCH ANGIOPLASTY Left 03/23/2016   Procedure: PATCH ANGIOPLASTY LEFT CAROTID ARTERY USING [place];  Surgeon: Mal Misty, MD;  Location: [facility];  Service: Vascular;  Laterality: Left;       Home Medications    Prior to Admission medications   Medication Sig Start Date End Date Taking? Authorizing Provider  Biotin 5000 MCG CAPS Take 2,500 mcg by mouth daily.     Historical Provider, MD  Chlorpheniramine-DM 4-30 MG TABS Take 1 tablet by mouth daily as needed (for cold symptoms).    Historical Provider, MD  Methylfol-Methylcob-Acetylcyst (METAFOLBIC PLUS) 6-2-600 MG TABS Take 1 tablet by mouth daily. Patient taking differently: Take 1 tablet by mouth daily. Pt. Is taking for food supplement for memory problem 02/16/16   Garvin Fila, MD  metoprolol tartrate (LOPRESSOR) 25 MG tablet Take 1 tablet (25 mg total) by mouth 2 (two) times daily. Patient not taking: Reported on 04/19/2016 03/22/16   Wellington Hampshire, MD  Omega-3 Fatty Acids ([employer] FISH OIL) 1200 MG CAPS Take 1,000 mg by mouth once a week.     Historical Provider, MD  oxyCODONE-acetaminophen (PERCOCET/ROXICET) 5-325 MG tablet Take 1-2 tablets by mouth every 4 (four) hours as needed for moderate pain. Patient not taking: Reported on 04/19/2016 03/23/16   Ulyses Amor, PA-C  Probiotic Product (PROBIOTIC DAILY PO) Take 1 tablet by mouth daily.  Historical Provider, MD  vitamin A 10000 UNIT capsule Take 10,000 Units by mouth daily.    Historical Provider, MD  vitamin C (ASCORBIC ACID) 500 MG tablet Take 500 mg by mouth daily.    Historical Provider, MD    Family History Family History  Problem Relation Age of Onset  . Cancer Father     Social History Social History  Substance Use Topics  . Smoking status: Former Smoker    Quit date: 03/16/2009  . Smokeless tobacco:  Never Used  . Alcohol use No     Allergies   Other and Valium [diazepam]   Review of Systems Review of Systems  Unable to perform ROS: Mental status change     Physical Exam Updated Vital Signs BP 136/70   Pulse 80   Temp 97.6 F (36.4 C) (Axillary)   Resp 14   Ht 5' 11.26" (1.81 m)   Wt 73.9 kg   SpO2 100%   BMI 22.56 kg/m   Physical Exam  Constitutional: He appears well-developed and well-nourished.  HENT:  Head: Normocephalic and atraumatic.  Eyes: EOM are normal. Pupils are equal, round, and reactive to light.  Neck: Neck supple.  Cardiovascular: Normal rate and intact distal pulses.   Pulmonary/Chest: Effort normal. No respiratory distress.  Abdominal: He exhibits no distension.  Musculoskeletal:  Moves all extremities equally.  Neurological:  Patient is alert. Expressive aphasia. Mild R facial droop.     ED Treatments / Results  Labs (all labs ordered are listed, but only abnormal results are displayed) Labs Reviewed  APTT - Abnormal; Notable for the following:       Result Value   aPTT 39 (*)    All other components within normal limits  CBC - Abnormal; Notable for the following:    WBC 12.0 (*)    RDW 15.6 (*)    All other components within normal limits  DIFFERENTIAL - Abnormal; Notable for the following:    Neutro Abs 9.0 (*)    All other components within normal limits  COMPREHENSIVE METABOLIC PANEL - Abnormal; Notable for the following:    Chloride 100 (*)    Creatinine, Ser 1.49 (*)    ALT 14 (*)    GFR calc non Af Amer 43 (*)    GFR calc Af Amer 49 (*)    All other components within normal limits  BLOOD GAS, ARTERIAL - Abnormal; Notable for the following:    pH, Arterial 7.464 (*)    pO2, Arterial 393 (*)    All other components within normal limits  TRIGLYCERIDES - Abnormal; Notable for the following:    Triglycerides 154 (*)    All other components within normal limits  GLUCOSE, CAPILLARY - Abnormal; Notable for the following:      Glucose-Capillary 109 (*)    All other components within normal limits  GLUCOSE, CAPILLARY - Abnormal; Notable for the following:    Glucose-Capillary 121 (*)    All other components within normal limits  I-STAT CHEM 8, ED - Abnormal; Notable for the following:    BUN 25 (*)    Creatinine, Ser 1.40 (*)    Glucose, Bld 102 (*)    Calcium, Ion 0.95 (*)    Hemoglobin 17.3 (*)    All other components within normal limits  MRSA PCR SCREENING  PROTIME-INR  ETHANOL  RAPID URINE DRUG SCREEN, HOSP PERFORMED  URINALYSIS, ROUTINE W REFLEX MICROSCOPIC  HEMOGLOBIN A1C  CBC WITH DIFFERENTIAL/PLATELET  BASIC METABOLIC PANEL  LIPID PANEL  I-STAT TROPOININ, ED  CBG MONITORING, ED  I-STAT CHEM 8, ED  I-STAT TROPOININ, ED    EKG  EKG Interpretation None       Radiology Ct Angio Head W Or Wo Contrast  Result Date: 11/28/2016 CLINICAL DATA:  Slurred speech EXAM: CT PERFUSION BRAIN CT angiography head and neck TECHNIQUE: Multiphase CT imaging of the brain was performed following IV bolus contrast injection. Subsequent parametric perfusion maps were calculated using RAPID software. CT angiography of the head and neck were also performed. Stenosis measurements, as reported, are calculated using the NASCET criteria. CONTRAST:  90 mL Isovue 370 IV her COMPARISON:  Head CT 11/28/2016 and 09/12/2011 FINDINGS: CTA NECK FINDINGS Aortic arch: There is atherosclerotic calcification of the aortic arch with mild-to-moderate narrowing at the arch vessel origins. Proximal subclavian arteries are patent. There is a normal 3 vessel arch configuration. The visualized proximal subclavian arteries are normal. Right carotid system: There is partially calcified plaque at the right carotid bifurcation without hemodynamically significant stenosis. Left carotid system: There is predominantly noncalcified plaque at the proximal left common carotid artery and within the distal left common carotid artery, resulting in  approximately 25% narrowing of the arterial lumen. The left external carotid artery is occluded near its origin, with distal reconstitution via collateral pathways. There is predominantly noncalcified plaque along most of the course of the left internal carotid artery resulting in stenosis that measures up to approximately 50%. Vertebral arteries: The vertebral system is left dominant. There is moderate narrowing of the left vertebral artery origin secondary to atherosclerotic plaque. The right vertebral artery is occluded at its origin and along its entire cervical length. Course and caliber of the left vertebral artery are normal to the junction with the basilar artery. There is minimal opacification of the right V4 segment secondary to collateral flow. Skeleton: There is no bony spinal canal stenosis. No lytic or blastic lesions. Other neck: The nasopharynx is clear. The oropharynx and hypopharynx are normal. The epiglottis is normal. The supraglottic larynx, glottis and subglottic larynx are normal. No retropharyngeal collection. The parapharyngeal spaces are preserved. The parotid and submandibular glands are normal. No sialolithiasis or salivary ductal dilatation. The thyroid gland is normal. There is no cervical lymphadenopathy. Upper chest: Biapical centrilobular emphysema.  No pleural effusion. Review of the MIP images confirms the above findings CTA HEAD FINDINGS Anterior circulation: --Intracranial internal carotid arteries: There is atherosclerotic calcification of both intracranial internal carotid artery is without severe stenosis. --Anterior cerebral arteries: Normal. --Middle cerebral arteries: There is occlusion of the left MCA M2 superior division. The right MCA is normal. --Posterior communicating arteries: Present on the right. Posterior circulation: --Posterior cerebral arteries: Normal. --Superior cerebellar arteries: Normal. --Basilar artery: Normal. --Anterior inferior cerebellar arteries:  Normal. --Posterior inferior cerebellar arteries: Normal. Venous sinuses: As permitted by contrast timing, patent. Anatomic variants: Predominantly fetal origin of the right posterior cerebral artery. Delayed phase: Not performed Review of the MIP images confirms the above findings CT Brain Perfusion Findings: CBF (<30%) Volume: 21mL Perfusion (Tmax>6.0s) volume: Mismatch Volume: Infarction Location:Left MCA distribution IMPRESSION: 1. Perfusion abnormalities consistent with left MCA infarct with mismatch volume measuring 235 mL. 2. 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 3. 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 Posterior cerebral circulation remains patent. 4. Left carotid system atherosclerotic disease resulting and mild common carotid stenosis, proximal occlusion of the external carotid artery and approximately 50% narrowing of  the left internal carotid artery. Critical Value/emergent results were called by telephone at the time of interpretation on 11/28/2016 at 6:35 pm to Dr. Roland Rack , who verbally acknowledged these results. Electronically Signed   By: Ulyses Jarred M.D.   On: 11/28/2016 18:59   Ct Angio Neck W And/or Wo Contrast  Result Date: 11/28/2016 CLINICAL DATA:  Slurred speech EXAM: CT PERFUSION BRAIN CT angiography head and neck TECHNIQUE: Multiphase CT imaging of the brain was performed following IV bolus contrast injection. Subsequent parametric perfusion maps were calculated using RAPID software. CT angiography of the head and neck were also performed. Stenosis measurements, as reported, are calculated using the NASCET criteria. CONTRAST:  90 mL Isovue 370 IV her COMPARISON:  Head CT 11/28/2016 and 09/12/2011 FINDINGS: CTA NECK FINDINGS Aortic arch: There is atherosclerotic calcification of the aortic arch with mild-to-moderate narrowing at the arch vessel origins. Proximal subclavian  arteries are patent. There is a normal 3 vessel arch configuration. The visualized proximal subclavian arteries are normal. Right carotid system: There is partially calcified plaque at the right carotid bifurcation without hemodynamically significant stenosis. Left carotid system: There is predominantly noncalcified plaque at the proximal left common carotid artery and within the distal left common carotid artery, resulting in approximately 25% narrowing of the arterial lumen. The left external carotid artery is occluded near its origin, with distal reconstitution via collateral pathways. There is predominantly noncalcified plaque along most of the course of the left internal carotid artery resulting in stenosis that measures up to approximately 50%. Vertebral arteries: The vertebral system is left dominant. There is moderate narrowing of the left vertebral artery origin secondary to atherosclerotic plaque. The right vertebral artery is occluded at its origin and along its entire cervical length. Course and caliber of the left vertebral artery are normal to the junction with the basilar artery. There is minimal opacification of the right V4 segment secondary to collateral flow. Skeleton: There is no bony spinal canal stenosis. No lytic or blastic lesions. Other neck: The nasopharynx is clear. The oropharynx and hypopharynx are normal. The epiglottis is normal. The supraglottic larynx, glottis and subglottic larynx are normal. No retropharyngeal collection. The parapharyngeal spaces are preserved. The parotid and submandibular glands are normal. No sialolithiasis or salivary ductal dilatation. The thyroid gland is normal. There is no cervical lymphadenopathy. Upper chest: Biapical centrilobular emphysema.  No pleural effusion. Review of the MIP images confirms the above findings CTA HEAD FINDINGS Anterior circulation: --Intracranial internal carotid arteries: There is atherosclerotic calcification of both  intracranial internal carotid artery is without severe stenosis. --Anterior cerebral arteries: Normal. --Middle cerebral arteries: There is occlusion of the left MCA M2 superior division. The right MCA is normal. --Posterior communicating arteries: Present on the right. Posterior circulation: --Posterior cerebral arteries: Normal. --Superior cerebellar arteries: Normal. --Basilar artery: Normal. --Anterior inferior cerebellar arteries: Normal. --Posterior inferior cerebellar arteries: Normal. Venous sinuses: As permitted by contrast timing, patent. Anatomic variants: Predominantly fetal origin of the right posterior cerebral artery. Delayed phase: Not performed Review of the MIP images confirms the above findings CT Brain Perfusion Findings: CBF (<30%) Volume: 37m Perfusion (Tmax>6.0s) volume: 2363mMismatch Volume: 23558mnfarction Location:Left MCA distribution IMPRESSION: 1. Perfusion abnormalities consistent with left MCA infarct with mismatch volume measuring 235 mL. 2. 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 3. 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 Posterior cerebral circulation remains patent. 4. Left carotid system atherosclerotic disease resulting and mild common  carotid stenosis, proximal occlusion of the external carotid artery and approximately 50% narrowing of the left internal carotid artery. Critical Value/emergent results were called by telephone at the time of interpretation on 11/28/2016 at 6:35 pm to Dr. Roland Rack , who verbally acknowledged these results. Electronically Signed   By: Ulyses Jarred M.D.   On: 11/28/2016 18:59   Ct Cerebral Perfusion W Contrast  Result Date: 11/28/2016 CLINICAL DATA:  Slurred speech EXAM: CT PERFUSION BRAIN CT angiography head and neck TECHNIQUE: Multiphase CT imaging of the brain was performed following IV bolus contrast injection. Subsequent parametric perfusion maps  were calculated using RAPID software. CT angiography of the head and neck were also performed. Stenosis measurements, as reported, are calculated using the NASCET criteria. CONTRAST:  90 mL Isovue 370 IV her COMPARISON:  Head CT 11/28/2016 and 09/12/2011 FINDINGS: CTA NECK FINDINGS Aortic arch: There is atherosclerotic calcification of the aortic arch with mild-to-moderate narrowing at the arch vessel origins. Proximal subclavian arteries are patent. There is a normal 3 vessel arch configuration. The visualized proximal subclavian arteries are normal. Right carotid system: There is partially calcified plaque at the right carotid bifurcation without hemodynamically significant stenosis. Left carotid system: There is predominantly noncalcified plaque at the proximal left common carotid artery and within the distal left common carotid artery, resulting in approximately 25% narrowing of the arterial lumen. The left external carotid artery is occluded near its origin, with distal reconstitution via collateral pathways. There is predominantly noncalcified plaque along most of the course of the left internal carotid artery resulting in stenosis that measures up to approximately 50%. Vertebral arteries: The vertebral system is left dominant. There is moderate narrowing of the left vertebral artery origin secondary to atherosclerotic plaque. The right vertebral artery is occluded at its origin and along its entire cervical length. Course and caliber of the left vertebral artery are normal to the junction with the basilar artery. There is minimal opacification of the right V4 segment secondary to collateral flow. Skeleton: There is no bony spinal canal stenosis. No lytic or blastic lesions. Other neck: The nasopharynx is clear. The oropharynx and hypopharynx are normal. The epiglottis is normal. The supraglottic larynx, glottis and subglottic larynx are normal. No retropharyngeal collection. The parapharyngeal spaces are  preserved. The parotid and submandibular glands are normal. No sialolithiasis or salivary ductal dilatation. The thyroid gland is normal. There is no cervical lymphadenopathy. Upper chest: Biapical centrilobular emphysema.  No pleural effusion. Review of the MIP images confirms the above findings CTA HEAD FINDINGS Anterior circulation: --Intracranial internal carotid arteries: There is atherosclerotic calcification of both intracranial internal carotid artery is without severe stenosis. --Anterior cerebral arteries: Normal. --Middle cerebral arteries: There is occlusion of the left MCA M2 superior division. The right MCA is normal. --Posterior communicating arteries: Present on the right. Posterior circulation: --Posterior cerebral arteries: Normal. --Superior cerebellar arteries: Normal. --Basilar artery: Normal. --Anterior inferior cerebellar arteries: Normal. --Posterior inferior cerebellar arteries: Normal. Venous sinuses: As permitted by contrast timing, patent. Anatomic variants: Predominantly fetal origin of the right posterior cerebral artery. Delayed phase: Not performed Review of the MIP images confirms the above findings CT Brain Perfusion Findings: CBF (<30%) Volume: 45m Perfusion (Tmax>6.0s) volume: 2357mMismatch Volume: 23537mnfarction Location:Left MCA distribution IMPRESSION: 1. Perfusion abnormalities consistent with left MCA infarct with mismatch volume measuring 235 mL. 2. 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 3. 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 Posterior cerebral circulation  remains patent. 4. Left carotid system atherosclerotic disease resulting and mild common carotid stenosis, proximal occlusion of the external carotid artery and approximately 50% narrowing of the left internal carotid artery. Critical Value/emergent results were called by telephone at the time of interpretation on 11/28/2016 at 6:35 pm to  Dr. Roland Rack , who verbally acknowledged these results. Electronically Signed   By: Ulyses Jarred M.D.   On: 11/28/2016 18:59   Dg Chest Port 1 View  Result Date: 11/28/2016 CLINICAL DATA:  82 y/o  M; post intubation. EXAM: PORTABLE CHEST 1 VIEW COMPARISON:  08/22/2011 chest radiograph FINDINGS: Clear lungs. No pleural effusion. Stable cardiac silhouette given projection and technique. Bones are unremarkable. Endotracheal tube is approximately or in 3.6 cm from the carina. IMPRESSION: No acute cardiopulmonary process. Endotracheal tube is approximately 3.6 cm from the carina. Electronically Signed   By: Kristine Garbe M.D.   On: 11/28/2016 23:23   Ct Head Code Stroke Wo Contrast`  Addendum Date: 11/28/2016   ADDENDUM REPORT: 11/28/2016 23:11 ADDENDUM: Though findings could represent contrast staining, this is an atypical appearance for said entity. Acute findings discussed with and reconfirmed by Dr.LINDZEN on 11/28/2016 at 11:00 pm. Electronically Signed   By: Elon Alas M.D.   On: 11/28/2016 23:11   Result Date: 11/28/2016 CLINICAL DATA:  Code stroke. Status post revascularization and clot retrieval LEFT MCA. 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 EXAM: CT HEAD WITHOUT CONTRAST TECHNIQUE: Contiguous axial images were obtained from the base of the skull through the vertex without intravenous contrast. COMPARISON:  None. FINDINGS: BRAIN: Intravascular contrast limits evaluation of intracranial hemorrhage. No lobar hematoma, midline shift or mass effect. Faint enhancement LEFT temporal lobe. Patchy to confluent supratentorial white matter hypodensities. Old bilateral basal ganglia lacunar infarcts. No abnormal extra-axial fluid collections. Basal cisterns are patent. VASCULAR: Moderate calcific atherosclerosis of the carotid siphons. Contrast opacifies LEFT middle cerebral artery. SKULL: No skull fracture. LEFT sphenoid intraosseous lipoma. Probable  bone island RIGHT superolateral orbital wall. No significant scalp soft tissue swelling. SINUSES/ORBITS: Trace LEFT mastoid effusion. Mild paranasal sinus mucosal thickening without air-fluid levels. The included ocular globes and orbital contents are non-suspicious. OTHER: Patient is edentulous.  Life-support lines in place. ASPECTS [facility] Stroke Program Early CT Score) - Ganglionic level infarction (caudate, lentiform nuclei, internal capsule, insula, M1-M3 cortex): 7 - Supraganglionic infarction (M4-M6 cortex): 3 Total score (0-10 with 10 being normal): 10 IMPRESSION: 1. Status post endovascular revascularization LEFT MCA, faint enhancing LEFT temporal lobe consistent with collateral vessels in the setting of acute stroke, or, can be seen in subacute infarcts. Moderate to severe chronic small vessel ischemic disease, old bilateral basal ganglia lacunar infarcts. 2. ASPECTS is 10 though, limited by presence of intravascular contrast. Referring clinician being paged by the Radiology assistant, pending return call. Electronically Signed: By: Elon Alas M.D. On: 11/28/2016 22:47   Ct Head Code Stroke Wo Contrast`  Result Date: 11/28/2016 CLINICAL DATA:  Code stroke.  Slurred speech EXAM: CT HEAD WITHOUT CONTRAST TECHNIQUE: Contiguous axial images were obtained from the base of the skull through the vertex without intravenous contrast. COMPARISON:  Head CT 09/12/2011 FINDINGS: Brain: No mass lesion, intraparenchymal hemorrhage or extra-axial collection. No evidence of acute cortical infarct. There is loss encephalomalacia in the right parietal and occipital lobes, compatible with prior infarcts. There is an old right lenticular capsular lacunar infarct. There is periventricular hypoattenuation compatible with chronic microvascular disease. Vascular: Mild calcification of the internal carotid arteries at the  skullbase. Skull: Normal visualized skull base, calvarium and extracranial soft tissues.  Sinuses/Orbits: Partial opacification of the left mastoid air cells. Normal orbits. ASPECTS [facility] Stroke Program Early CT Score) - Ganglionic level infarction (caudate, lentiform nuclei, internal capsule, insula, M1-M3 cortex): 7 - Supraganglionic infarction (M4-M6 cortex): 3 Total score (0-10 with 10 being normal): 10 IMPRESSION: 1. No acute intracranial hemorrhage. Findings of multiple old infarcts and chronic microvascular ischemia. 2. ASPECTS is 10. These results were called by telephone at the time of interpretation on 11/28/2016 at 6:21 pm to Dr. Kathrynn Speed , who verbally acknowledged these results. Electronically Signed   By: Ulyses Jarred M.D.   On: 11/28/2016 18:22    Procedures Procedures (including critical care time)  Medications Ordered in ED Medications  0.9 %  sodium chloride infusion ( Intravenous New Bag/Given 11/29/16 0038)  acetaminophen (TYLENOL) tablet 650 mg (not administered)    Or  acetaminophen (TYLENOL) solution 650 mg (not administered)    Or  acetaminophen (TYLENOL) suppository 650 mg (not administered)  ceFAZolin (ANCEF) 2-4 GM/100ML-% IVPB (not administered)  ondansetron (ZOFRAN) injection 4 mg (not administered)  nicardipine (CARDENE) '20mg'$  in 0.86% saline 261m IV infusion (0.1 mg/ml) (5 mg/hr Intravenous Rate/Dose Change 11/29/16 0321)  0.9 %  sodium chloride infusion ( Intravenous Duplicate 132/95/1828416  fentaNYL (SUBLIMAZE) injection 50 mcg (50 mcg Intravenous Given by Other 11/29/16 0133)  fentaNYL (SUBLIMAZE) injection 50 mcg (not administered)  chlorhexidine gluconate (MEDLINE KIT) (PERIDEX) 0.12 % solution 15 mL (15 mLs Mouth Rinse Given 11/29/16 0134)  MEDLINE mouth rinse (15 mLs Mouth Rinse Given 11/29/16 0345)  propofol (DIPRIVAN) 1000 MG/100ML infusion (50 mcg/kg/min  73.9 kg Intravenous Canceled Entry 11/29/16 0320)  famotidine (PEPCID) IVPB 20 mg premix (20 mg Intravenous Given by Other 11/29/16 0133)  insulin aspart (novoLOG)  injection 0-9 Units (1 Units Subcutaneous Given 11/29/16 0343)  iopamidol (ISOVUE-370) 76 % injection (90 mLs  Contrast Given 11/28/16 1824)   stroke: mapping our early stages of recovery book ( Does not apply Given 11/28/16 2245)  iopamidol (ISOVUE-300) 61 % injection (125 mLs  Contrast Given 11/28/16 2142)  eptifibatide (INTEGRILIN) 20 MG/10ML injection (1.5 mg  Given 11/28/16 2106)  nitroGLYCERIN 100 MCG/ML intra-arterial injection (25 mcg  Given 11/28/16 2125)  propofol (DIPRIVAN) 1000 MSA/630ZSinfusion (  Duplicate 101/09/3223557     Initial Impression / Assessment and Plan / ED Course  I have reviewed the triage vital signs and the nursing notes.  Pertinent labs & imaging results that were available during my care of the patient were reviewed by me and considered in my medical decision making (see chart for details).  Clinical Course     Patient is an 82year old male past medical history as above who presents with concerns for aphasia Patient is evaluated quickly on arrival and airway was intact. Neurology was present on patient arrival patient was [facility] the [facility] contrast CT head. CT head is negative or acute infarct. CTA w/ CT perfusion was performed which demonstrated a a large penumbra in left hemisphere with left M2 occlusion. Patient is out of the window for TPA. After discussion with family by neurology patient was taken to interventional radiology for thrombectomy. Patient will be admitted to the neuro ICU following procedure for further management.  Patient seen and discussed with Dr. NKathrynn Humble ED attending  Final Clinical Impressions(s) / ED Diagnoses   Final diagnoses:  Aphasia  Stroke (cerebrum) ([name]  Coarctation of aorta, recurrent, post-intervention  New Prescriptions Current Discharge Medication List       Gibson Ramp, MD 11/29/16 [place], MD 11/30/16 [phone]

## 2016-11-28 NOTE — Anesthesia Preprocedure Evaluation (Addendum)
Anesthesia Evaluation  Patient identified by MRN, date of birth, ID band  Reviewed: Allergy & Precautions, Patient's Chart, lab work & pertinent test results, Unable to perform ROS - Chart review onlyPreop documentation limited or incomplete due to emergent nature of procedure.  History of Anesthesia Complications (+) Emergence Delirium and history of anesthetic complications  Airway Mallampati: II  TM Distance: >3 FB Neck ROM: full    Dental   Pulmonary neg pulmonary ROS, former smoker,    breath sounds clear to auscultation       Cardiovascular hypertension, On Medications and Pt. on home beta blockers + Peripheral Vascular Disease  Normal cardiovascular exam Rhythm:Irregular Rate:Normal     Neuro/Psych PSYCHIATRIC DISORDERS Left MCA occlusion last seen at 1300, s/p Left CEA 03/2016 CVA, Residual Symptoms negative psych ROS   GI/Hepatic negative GI ROS, Neg liver ROS, GERD  Medicated,  Endo/Other  negative endocrine ROS  Renal/GU Renal InsufficiencyRenal diseaseCr 1.4  negative genitourinary   Musculoskeletal negative musculoskeletal ROS (+)   Abdominal   Peds negative pediatric ROS (+)  Hematology negative hematology ROS (+)   Anesthesia Other Findings   Reproductive/Obstetrics negative OB ROS                            Anesthesia Physical Anesthesia Plan  ASA: III and emergent  Anesthesia Plan: General   Post-op Pain Management:    Induction: Intravenous  Airway Management Planned: Oral ETT  Additional Equipment: Arterial line  Intra-op Plan:   Post-operative Plan: Post-operative intubation/ventilation  Informed Consent:   History available from chart only  Plan Discussed with: CRNA and Surgeon  Anesthesia Plan Comments:         Anesthesia Quick Evaluation

## 2016-11-28 NOTE — Anesthesia Procedure Notes (Signed)
Procedure Name: Intubation Date/Time: 11/28/2016 7:56 PM Performed by: Rejeana Brock L Pre-anesthesia Checklist: Patient identified, Emergency Drugs available, Suction available and Patient being monitored Patient Re-evaluated:Patient Re-evaluated prior to inductionOxygen Delivery Method: Circle System Utilized Preoxygenation: Pre-oxygenation with 100% oxygen Intubation Type: IV induction Ventilation: Mask ventilation without difficulty Laryngoscope Size: Mac and 4 Grade View: Grade I Tube type: Subglottic suction tube Tube size: 7.5 mm Number of attempts: 1 Airway Equipment and Method: Stylet and Oral airway Placement Confirmation: ETT inserted through vocal cords under direct vision,  positive ETCO2 and breath sounds checked- equal and bilateral Secured at: 22 cm Tube secured with: Tape Dental Injury: Teeth and Oropharynx as per pre-operative assessment

## 2016-11-28 NOTE — ED Notes (Signed)
POCT CBG resulted 97; Benjamine Mola, RN aware

## 2016-11-28 NOTE — ED Notes (Signed)
To IR with RR RN

## 2016-11-28 NOTE — ED Triage Notes (Addendum)
Per EMS pt from home- daughter called and noted patient to be aphasic and have dysarthria around 1400. LSN was 1300. Patient has no facial droop, equal hand grips, no weakness noted. Patient alert able to answer some questions- but had difficulty getting words out.

## 2016-11-28 NOTE — ED Notes (Signed)
Patients symptoms seem to be waxing and waning with facial droop and dysarthria coming and going and aphasia going from mild to severe.

## 2016-11-28 NOTE — ED Notes (Signed)
Dr. Leonel Ramsay discussing treatment options with patient and family

## 2016-11-28 NOTE — H&P (Signed)
Neurology H&P  CC: Aphasia  History is obtained from: Family  HPI: [name] is a 82 y.o. male with a history of carotid endarterectomy on the left and April who presents with aphasia. He last spoke to someone around 1 PM, and subsequently was noticed by his family that he was speaking nonsensical statements. On arrival, he had some facial droop, aphasia and dysarthria. In the emergency department he did wax and wane, improving some and then worsening at times.  Due to his symptoms, a CT perfusion was obtained which did demonstrate a very large penumbra with an M2 occlusion.  I had a long discussion with family, there was some delay in decision-making due to the time spent discussing it with family and discussion amongst the family. After presenting options of IR vs conservative management in the ICU the family agreed to proceed with intervention.  LKW: 1 PM tpa given?: no, out of window Premorbid Modified Rankin score: 1   ROS:  Unable to obtain due to altered mental status.   Past Medical History:  Diagnosis Date  . Complication of anesthesia    cognitive dysfunction > 6 months after a-gram with Versed/Fentanyl '12. Family attributes to Versed. Request that BZDs be avoided.  . Depression   . Dysrhythmia    has had Metoprolol recommended, mail order has not delivered it yet.    . Erectile dysfunction   . GERD (gastroesophageal reflux disease)   . PAD (peripheral artery disease) (HCC)      Family History  Problem Relation Age of Onset  . Cancer Father      Social History:  reports that he quit smoking about 7 years ago. He has never used smokeless tobacco. He reports that he does not drink alcohol. His drug history is not on file.   Exam: Current vital signs: BP (!) 207/121   Pulse 88   Temp 97.7 F (36.5 C) (Oral)   Resp 20   Wt 73.9 kg (162 lb 14.7 oz)   SpO2 97%   BMI 22.72 kg/m  Vital signs in last 24 hours: Temp:  [97.7 F (36.5 C)] 97.7 F (36.5 C)  (12/11 1836) Pulse Rate:  [73-90] 88 (12/11 1920) Resp:  [12-20] 20 (12/11 1920) BP: (188-233)/(97-121) 207/121 (12/11 1920) SpO2:  [96 %-98 %] 97 % (12/11 1920) Weight:  [73.9 kg (162 lb 14.7 oz)] 73.9 kg (162 lb 14.7 oz) (12/11 1858)  Physical Exam  Constitutional: Appears well-developed and well-nourished.  Psych: Affect appropriate to situation Eyes: No scleral injection HENT: No OP obstrucion Head: Normocephalic.  Cardiovascular: Normal rate and regular rhythm.  Respiratory: Effort normal and breath sounds normal to anterior ascultation GI: Soft.  No distension. There is no tenderness.  Skin: WDI  Neuro: Mental Status: Patient is awake, alert, oriented to person, place, month, year, and situation. Patient is able to give a clear and coherent history. Initially, he is fairly aphasic but able to follow commands,  Much of his speech was nonsensical and he was able to repeat. He subsequently did improve to intact repetition with mild expressive aphasia, and then worsened again to moderate expressive aphasia. He was dysarthric on arrival. Cranial Nerves: II: Visual Fields are full. Pupils are equal, round, and reactive to light.   III,IV, VI: EOMI without ptosis or diploplia.  V: Facial sensation is symmetric to temperature VII: Facial movement is right facial droop initially, this then improves VIII: hearing is intact to voice X: Uvula elevates symmetrically XI: Shoulder shrug is  symmetric. XII: tongue is midline without atrophy or fasciculations.  Motor: Tone is normal. Bulk is normal. 5/5 strength was present in all four extremities.  Sensory: Sensation is symmetric to light touch and temperature in the arms and legs. Cerebellar: He does not cooperate with formal testing, but there is no clear ataxia  I have reviewed labs in epic and the results pertinent to this consultation are: He is a borderline creatinine of 1.4  I have reviewed the images obtained: CT perfusion  large penumbra in the left hemisphere, CT angiogram left M2 occlusion  Impression: 82 year old male with acute left M2 occlusion and waxing/waning symptoms. I discussed intervention with family and they wish to proceed.  Recommendations: 1) to IR for mechanical thrombectomy. 2. HgbA1c, fasting lipid panel 3. MRI of the brain without contrast 4. Frequent neuro checks 5. Echocardiogram 6. Prophylactic therapy-Antiplatelet med: Aspirin - dose 325mg  PO or 300mg  PR  7. Risk factor modification 8. Telemetry monitoring 9. PT consult, OT consult, Speech consult 10. please page stroke NP  Or  PA  Or MD  from 8am -4 pm starting 12/12 as this patient will be followed by the stroke team at this point.   You can look them up on [email or web address]     This patient is critically ill and at significant risk of neurological worsening, death and care requires constant monitoring of vital signs, hemodynamics,respiratory and cardiac monitoring, neurological assessment, discussion with family, other specialists and medical decision making of high complexity. I spent 65 minutes of neurocritical care time  in the care of  this patient.  Roland Rack, MD [facility] [phone]  If 7pm- 7am, please page neurology on call as listed in [place]. 11/28/2016  9:06 PM

## 2016-11-28 NOTE — ED Notes (Signed)
Patient is in CT at this time

## 2016-11-28 NOTE — ED Notes (Signed)
Son in the waiting room

## 2016-11-28 NOTE — Code Documentation (Signed)
82 year old male presents to [facility] via [place] as code stroke.  Family reports he was LSW at 86 today at home having normal conversation with his son.  Around 4pm or 430 pm his daughter called to tell him Happy Birthday and noticed he was having difficulty with word finding - and his conversation was making no sense.  EMS was called and code stroke was called in the field.  On arrival he had severe aphasia - mild right facial droop - and some dysarthria.  He was cleared at the bridge and taken to CT scan where he had CT without contrast - then a CT perfusion and CT angio head and neck. Dr. Leonel Ramsay is at the bedside.  Initial NIHSS 7.   Post CT scan speech was better.  Family at bedside.  Patient with waxing and waning symptoms. Speech intermittenly better then worse again.  Decision to take to IR made at 1903 with Dr. Leonel Ramsay and Dr. Estanislado Pandy.  IR team page at 1904.  Patient prepped with bilateral groin shave and foley insertion.  To IR suite at 1925 - handoff to [place], Jeneen Rinks and [place].

## 2016-11-28 NOTE — ED Notes (Signed)
Patient undressed, in gown, on monitor, continuous pulse oximetry and blood pressure cuff; visitor at bedside; assisted patient with urinal, patient cannot go at this time

## 2016-11-28 NOTE — Procedures (Signed)
S/P bilateral common carotid artery and Lt VERT artery angiograms followed by endovascular revascularization of occluded sup division of LT MCA  With x 1 pass with the solitaire    88mm x 40 mm retrieval device and  approx 7 mg of IA integrelin achieving a TICI 2 b reperfusion. Also mod severe intracranial arteriosclerosis diffusely. approx 85 to 90% stenosis of dominant LT VA origin. Occluded   Lt ECA approx 60 % stenosis of Lt VBJ

## 2016-11-29 ENCOUNTER — Inpatient Hospital Stay (HOSPITAL_COMMUNITY): Payer: Commercial Managed Care - HMO

## 2016-11-29 ENCOUNTER — Encounter (HOSPITAL_COMMUNITY): Payer: Self-pay | Admitting: Interventional Radiology

## 2016-11-29 DIAGNOSIS — Q251 Coarctation of aorta: Secondary | ICD-10-CM

## 2016-11-29 DIAGNOSIS — J988 Other specified respiratory disorders: Secondary | ICD-10-CM

## 2016-11-29 DIAGNOSIS — Z9289 Personal history of other medical treatment: Secondary | ICD-10-CM

## 2016-11-29 DIAGNOSIS — I6350 Cerebral infarction due to unspecified occlusion or stenosis of unspecified cerebral artery: Secondary | ICD-10-CM

## 2016-11-29 LAB — GLUCOSE, CAPILLARY
GLUCOSE-CAPILLARY: 109 mg/dL — AB (ref 65–99)
GLUCOSE-CAPILLARY: 100 mg/dL — AB (ref 65–99)
Glucose-Capillary: 121 mg/dL — ABNORMAL HIGH (ref 65–99)
Glucose-Capillary: 129 mg/dL — ABNORMAL HIGH (ref 65–99)
Glucose-Capillary: 126 mg/dL — ABNORMAL HIGH (ref 65–99)
Glucose-Capillary: 134 mg/dL — ABNORMAL HIGH (ref 65–99)
Glucose-Capillary: 123 mg/dL — ABNORMAL HIGH (ref 65–99)

## 2016-11-29 LAB — BLOOD GAS, ARTERIAL
Acid-Base Excess: 1.5 mmol/L (ref 0.0–2.0)
Bicarbonate: 25 mmol/L (ref 20.0–28.0)
DRAWN BY: 42624
FIO2: 1
MECHVT: 600 mL
O2 Saturation: 99.6 %
PATIENT TEMPERATURE: 98.6
PEEP/CPAP: 5 cmH2O
PO2 ART: 393 mmHg — AB (ref 83.0–108.0)
RATE: 14 resp/min
pCO2 arterial: 35.3 mmHg (ref 32.0–48.0)
pH, Arterial: 7.464 — ABNORMAL HIGH (ref 7.350–7.450)

## 2016-11-29 LAB — URINALYSIS, ROUTINE W REFLEX MICROSCOPIC
Bilirubin Urine: NEGATIVE
GLUCOSE, UA: NEGATIVE mg/dL
Ketones, ur: NEGATIVE mg/dL
NITRITE: NEGATIVE
PH: 5 (ref 5.0–8.0)
Protein, ur: NEGATIVE mg/dL
SPECIFIC GRAVITY, URINE: 1.027 (ref 1.005–1.030)
Squamous Epithelial / LPF: NONE SEEN

## 2016-11-29 LAB — BASIC METABOLIC PANEL
ANION GAP: 8 (ref 5–15)
BUN: 16 mg/dL (ref 6–20)
CO2: 25 mmol/L (ref 22–32)
Calcium: 8.2 mg/dL — ABNORMAL LOW (ref 8.9–10.3)
Chloride: 103 mmol/L (ref 101–111)
Creatinine, Ser: 1.34 mg/dL — ABNORMAL HIGH (ref 0.61–1.24)
GFR calc Af Amer: 56 mL/min — ABNORMAL LOW (ref >60)
GFR, EST NON AFRICAN AMERICAN: 48 mL/min — AB (ref >60)
Glucose, Bld: 120 mg/dL — ABNORMAL HIGH (ref 65–99)
POTASSIUM: 3.8 mmol/L (ref 3.5–5.1)
SODIUM: 136 mmol/L (ref 135–145)

## 2016-11-29 LAB — LIPID PANEL
CHOL/HDL RATIO: 4.2 ratio
CHOLESTEROL: 156 mg/dL (ref 0–200)
HDL: 37 mg/dL — ABNORMAL LOW (ref >40)
LDL Cholesterol: 88 mg/dL (ref 0–99)
Triglycerides: 154 mg/dL — ABNORMAL HIGH (ref <150)
VLDL: 31 mg/dL (ref 0–40)

## 2016-11-29 LAB — CBC WITH DIFFERENTIAL/PLATELET
BASOS ABS: 0 10*3/uL (ref 0.0–0.1)
Basophils Relative: 0 %
EOS ABS: 0.3 10*3/uL (ref 0.0–0.7)
EOS PCT: 2 %
HCT: 44 % (ref 39.0–52.0)
Hemoglobin: 14.3 g/dL (ref 13.0–17.0)
Lymphocytes Relative: 6 %
Lymphs Abs: 1 10*3/uL (ref 0.7–4.0)
MCH: 29.3 pg (ref 26.0–34.0)
MCHC: 32.5 g/dL (ref 30.0–36.0)
MCV: 90.2 fL (ref 78.0–100.0)
Monocytes Absolute: 0.7 10*3/uL (ref 0.1–1.0)
Monocytes Relative: 5 %
Neutro Abs: 14.3 10*3/uL — ABNORMAL HIGH (ref 1.7–7.7)
Neutrophils Relative %: 87 %
PLATELETS: 368 10*3/uL (ref 150–400)
RBC: 4.88 MIL/uL (ref 4.22–5.81)
RDW: 15.4 % (ref 11.5–15.5)
WBC: 16.4 10*3/uL — AB (ref 4.0–10.5)

## 2016-11-29 LAB — RAPID URINE DRUG SCREEN, HOSP PERFORMED
AMPHETAMINES: NOT DETECTED
BARBITURATES: NOT DETECTED
Benzodiazepines: NOT DETECTED
Cocaine: NOT DETECTED
Opiates: NOT DETECTED
TETRAHYDROCANNABINOL: NOT DETECTED

## 2016-11-29 LAB — ECHOCARDIOGRAM COMPLETE
HEIGHTINCHES: 71 in
WEIGHTICAEL: 2564.39 [oz_av]

## 2016-11-29 LAB — TRIGLYCERIDES: TRIGLYCERIDES: 154 mg/dL — AB (ref <150)

## 2016-11-29 LAB — MRSA PCR SCREENING: MRSA by PCR: NEGATIVE

## 2016-11-29 MED ORDER — FAMOTIDINE IN NACL 20-0.9 MG/50ML-% IV SOLN
20.0000 mg | Freq: Every day | INTRAVENOUS | Status: DC
  Administered 2016-11-29 (×2): 20 mg via INTRAVENOUS
  Filled 2016-11-29 (×3): qty 50

## 2016-11-29 MED ORDER — CHLORHEXIDINE GLUCONATE 0.12% ORAL RINSE (MEDLINE KIT)
15.0000 mL | Freq: Two times a day (BID) | OROMUCOSAL | Status: DC
  Administered 2016-11-29 – 2016-11-30 (×5): 15 mL via OROMUCOSAL

## 2016-11-29 MED ORDER — ORAL CARE MOUTH RINSE
15.0000 mL | Freq: Four times a day (QID) | OROMUCOSAL | Status: DC
  Administered 2016-11-29 – 2016-11-30 (×7): 15 mL via OROMUCOSAL

## 2016-11-29 MED ORDER — ASPIRIN 300 MG RE SUPP
300.0000 mg | Freq: Every day | RECTAL | Status: DC
  Administered 2016-11-29 – 2016-11-30 (×2): 300 mg via RECTAL
  Filled 2016-11-29 (×2): qty 1

## 2016-11-29 MED ORDER — INSULIN ASPART 100 UNIT/ML ~~LOC~~ SOLN
0.0000 [IU] | SUBCUTANEOUS | Status: DC
  Administered 2016-11-29 – 2016-11-30 (×6): 1 [IU] via SUBCUTANEOUS

## 2016-11-29 MED ORDER — PROPOFOL 1000 MG/100ML IV EMUL
0.0000 ug/kg/min | INTRAVENOUS | Status: DC
  Administered 2016-11-28: 50 ug/kg/min via INTRAVENOUS
  Administered 2016-11-29: 40 ug/kg/min via INTRAVENOUS
  Administered 2016-11-29: 35 ug/kg/min via INTRAVENOUS
  Administered 2016-11-29: 50 ug/kg/min via INTRAVENOUS
  Administered 2016-11-29 (×2): 35 ug/kg/min via INTRAVENOUS
  Administered 2016-11-30 (×2): 40 ug/kg/min via INTRAVENOUS
  Filled 2016-11-29 (×5): qty 100

## 2016-11-29 NOTE — Care Management Note (Signed)
Case Management Note  Patient Details  Name: [name] MRN: [id number] Date of Birth: 05/18/1934  Subjective/Objective:   Pt admitted on 11/28/16 s/p Lt MCA infarct.  PTA, pt independent and living with son.                   Action/Plan: Met with family to discuss discharge planning.  Pt currently remains intubated.  Will follow progress.    Expected Discharge Date:                  Expected Discharge Plan:  IP Rehab Facility  In-House Referral:     Discharge planning Services  CM Consult  Post Acute Care Choice:    Choice offered to:     DME Arranged:    DME Agency:     HH Arranged:    HH Agency:     Status of Service:  In process, will continue to follow  If discussed at Long Length of Stay Meetings, dates discussed:    Additional Comments:  ,  M, RN 11/29/2016, 2:31 PM  

## 2016-11-29 NOTE — Progress Notes (Signed)
CT head performed for lack of RUE movement, which was noted as sedation from endovascular procedure was wearing off.   CT reveals cortical hypoattenuation with mild local mass effect involving the left insula and frontal operculum compatible with infarction. No intracranial hemorrhage identified.  Electronically signed: Dr. Kerney Elbe

## 2016-11-29 NOTE — Progress Notes (Signed)
Pt transported on vent to MRI. Vitals remained stable throughout the trip.

## 2016-11-29 NOTE — Progress Notes (Signed)
OT Cancellation Note  Patient Details Name: [name] MRN: [id number] DOB: September 04, 1934   Cancelled Treatment:    Reason Eval/Treat Not Completed: Patient not medically ready Pt currently with sheath currently in place.  Vonita Moss   OTR/L Pager: [phone] Office: [phone] .  11/29/2016, 10:08 AM

## 2016-11-29 NOTE — Progress Notes (Signed)
Referring Physician(s): Dr Royal Hawthorn  Supervising Physician: Luanne Bras  Patient Status:  [name] - In-pt  Chief Complaint:  CVA L MCA revascularization 12/11pm  Subjective:  CVA--presented to ED 12/11 pm S/P bilateral common carotid artery and Lt VERT artery angiograms followed by endovascular revascularization of occluded sup division of LT MCA  With x 1 pass with the solitaire    69mm x 40 mm retrieval device and  approx 7 mg of IA integrelin achieving a TICI 2 b reperfusion. Also mod severe intracranial arteriosclerosis diffusely. approx 85 to 90% stenosis of dominant LT VA origin. Occluded   Lt ECA approx 60 % stenosis of Lt VBJ  Tolerated procedure well This am - no movement on RT Left moving spontaneously-- follows no commands Vent/sedated  CT Head 12/12 am: IMPRESSION: 1. Cortical hypoattenuation with mild local mass effect involving the left insula and frontal operculum compatible with infarction. No intracranial hemorrhage identified.   Allergies: Other and Valium [diazepam]  Medications: Prior to Admission medications   Medication Sig Start Date End Date Taking? Authorizing Provider  Biotin 5000 MCG CAPS Take 2,500 mcg by mouth daily.     Historical Provider, MD  Chlorpheniramine-DM 4-30 MG TABS Take 1 tablet by mouth daily as needed (for cold symptoms).    Historical Provider, MD  Methylfol-Methylcob-Acetylcyst (METAFOLBIC PLUS) 6-2-600 MG TABS Take 1 tablet by mouth daily. Patient taking differently: Take 1 tablet by mouth daily. Pt. Is taking for food supplement for memory problem 02/16/16   Garvin Fila, MD  metoprolol tartrate (LOPRESSOR) 25 MG tablet Take 1 tablet (25 mg total) by mouth 2 (two) times daily. Patient not taking: Reported on 04/19/2016 03/22/16   Wellington Hampshire, MD  Omega-3 Fatty Acids ([employer] FISH OIL) 1200 MG CAPS Take 1,000 mg by mouth once a week.     Historical Provider, MD  oxyCODONE-acetaminophen (PERCOCET/ROXICET) 5-325 MG tablet  Take 1-2 tablets by mouth every 4 (four) hours as needed for moderate pain. Patient not taking: Reported on 04/19/2016 03/23/16   Ulyses Amor, PA-C  Probiotic Product (PROBIOTIC DAILY PO) Take 1 tablet by mouth daily.     Historical Provider, MD  vitamin A 10000 UNIT capsule Take 10,000 Units by mouth daily.    Historical Provider, MD  vitamin C (ASCORBIC ACID) 500 MG tablet Take 500 mg by mouth daily.    Historical Provider, MD     Vital Signs: BP (!) 132/58   Pulse 97   Temp 99.7 F (37.6 C) (Axillary)   Resp (!) 21   Ht 5\' 11"  (1.803 m)   Wt 160 lb 4.4 oz (72.7 kg)   SpO2 99%   BMI 22.35 kg/m   Physical Exam  Cardiovascular: Normal rate and regular rhythm.   Pulmonary/Chest:  Vent/sedated  Abdominal: Soft.  Musculoskeletal:  Moving left spontaneously Follows no commands No movement on Rt  Left groin + sheath intact No bleeding No hematoma  Left foot 2+ pulses   Skin: Skin is warm and dry.  Nursing note and vitals reviewed.   Imaging: Ct Angio Head W Or Wo Contrast  Result Date: 11/28/2016 CLINICAL DATA:  Slurred speech EXAM: CT PERFUSION BRAIN CT angiography head and neck TECHNIQUE: Multiphase CT imaging of the brain was performed following IV bolus contrast injection. Subsequent parametric perfusion maps were calculated using RAPID software. CT angiography of the head and neck were also performed. Stenosis measurements, as reported, are calculated using the NASCET criteria. CONTRAST:  90 mL Isovue  370 IV her COMPARISON:  Head CT 11/28/2016 and 09/12/2011 FINDINGS: CTA NECK FINDINGS Aortic arch: There is atherosclerotic calcification of the aortic arch with mild-to-moderate narrowing at the arch vessel origins. Proximal subclavian arteries are patent. There is a normal 3 vessel arch configuration. The visualized proximal subclavian arteries are normal. Right carotid system: There is partially calcified plaque at the right carotid bifurcation without hemodynamically  significant stenosis. Left carotid system: There is predominantly noncalcified plaque at the proximal left common carotid artery and within the distal left common carotid artery, resulting in approximately 25% narrowing of the arterial lumen. The left external carotid artery is occluded near its origin, with distal reconstitution via collateral pathways. There is predominantly noncalcified plaque along most of the course of the left internal carotid artery resulting in stenosis that measures up to approximately 50%. Vertebral arteries: The vertebral system is left dominant. There is moderate narrowing of the left vertebral artery origin secondary to atherosclerotic plaque. The right vertebral artery is occluded at its origin and along its entire cervical length. Course and caliber of the left vertebral artery are normal to the junction with the basilar artery. There is minimal opacification of the right V4 segment secondary to collateral flow. Skeleton: There is no bony spinal canal stenosis. No lytic or blastic lesions. Other neck: The nasopharynx is clear. The oropharynx and hypopharynx are normal. The epiglottis is normal. The supraglottic larynx, glottis and subglottic larynx are normal. No retropharyngeal collection. The parapharyngeal spaces are preserved. The parotid and submandibular glands are normal. No sialolithiasis or salivary ductal dilatation. The thyroid gland is normal. There is no cervical lymphadenopathy. Upper chest: Biapical centrilobular emphysema.  No pleural effusion. Review of the MIP images confirms the above findings CTA HEAD FINDINGS Anterior circulation: --Intracranial internal carotid arteries: There is atherosclerotic calcification of both intracranial internal carotid artery is without severe stenosis. --Anterior cerebral arteries: Normal. --Middle cerebral arteries: There is occlusion of the left MCA M2 superior division. The right MCA is normal. --Posterior communicating arteries:  Present on the right. Posterior circulation: --Posterior cerebral arteries: Normal. --Superior cerebellar arteries: Normal. --Basilar artery: Normal. --Anterior inferior cerebellar arteries: Normal. --Posterior inferior cerebellar arteries: Normal. Venous sinuses: As permitted by contrast timing, patent. Anatomic variants: Predominantly fetal origin of the right posterior cerebral artery. Delayed phase: Not performed Review of the MIP images confirms the above findings CT Brain Perfusion Findings: CBF (<30%) Volume: 53mL Perfusion (Tmax>6.0s) volume: 256mL Mismatch Volume: 260mL Infarction Location:Left MCA distribution IMPRESSION: 1. Perfusion abnormalities consistent with left MCA infarct with mismatch volume measuring 235 mL. 2. 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 3. 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 Posterior cerebral circulation remains patent. 4. Left carotid system atherosclerotic disease resulting and mild common carotid stenosis, proximal occlusion of the external carotid artery and approximately 50% narrowing of the left internal carotid artery. Critical Value/emergent results were called by telephone at the time of interpretation on 11/28/2016 at 6:35 pm to Dr. Roland Rack , who verbally acknowledged these results. Electronically Signed   By: Ulyses Jarred M.D.   On: 11/28/2016 18:59   Ct Head Wo Contrast  Result Date: 11/29/2016 CLINICAL DATA:  82 y/o  M; stroke for follow-up. EXAM: CT HEAD WITHOUT CONTRAST TECHNIQUE: Contiguous axial images were obtained from the base of the skull through the vertex without intravenous contrast. COMPARISON:  11/28/2016 CT of the head. FINDINGS: Brain: Cortical hypoattenuation involving the left insular and frontal operculum  compatible with evolving infarction. Background of moderate to severe chronic microvascular ischemic changes and mild parenchymal volume loss similar  to prior study. Vascular: Persistent contrast opacification of the blood pool. Moderate calcification of the cavernous internal carotid arteries. Skull: Normal. Negative for fracture or focal lesion. Sinuses/Orbits: Right frontal sinus mucous retention cyst. Partial opacification of left mastoid air cell, probably related to intubation. Bilateral intra-ocular lens replacement. Other: None. IMPRESSION: 1. Cortical hypoattenuation with mild local mass effect involving the left insula and frontal operculum compatible with infarction. No intracranial hemorrhage identified. 2. Stable background of moderate to severe chronic microvascular ischemic changes and mild brain parenchymal volume loss. These results will be called to the ordering clinician or representative by the Radiologist Assistant, and communication documented in the PACS or zVision Dashboard. Electronically Signed   By: Kristine Garbe M.D.   On: 11/29/2016 06:30   Ct Angio Neck W And/or Wo Contrast  Result Date: 11/28/2016 CLINICAL DATA:  Slurred speech EXAM: CT PERFUSION BRAIN CT angiography head and neck TECHNIQUE: Multiphase CT imaging of the brain was performed following IV bolus contrast injection. Subsequent parametric perfusion maps were calculated using RAPID software. CT angiography of the head and neck were also performed. Stenosis measurements, as reported, are calculated using the NASCET criteria. CONTRAST:  90 mL Isovue 370 IV her COMPARISON:  Head CT 11/28/2016 and 09/12/2011 FINDINGS: CTA NECK FINDINGS Aortic arch: There is atherosclerotic calcification of the aortic arch with mild-to-moderate narrowing at the arch vessel origins. Proximal subclavian arteries are patent. There is a normal 3 vessel arch configuration. The visualized proximal subclavian arteries are normal. Right carotid system: There is partially calcified plaque at the right carotid bifurcation without hemodynamically significant stenosis. Left carotid system:  There is predominantly noncalcified plaque at the proximal left common carotid artery and within the distal left common carotid artery, resulting in approximately 25% narrowing of the arterial lumen. The left external carotid artery is occluded near its origin, with distal reconstitution via collateral pathways. There is predominantly noncalcified plaque along most of the course of the left internal carotid artery resulting in stenosis that measures up to approximately 50%. Vertebral arteries: The vertebral system is left dominant. There is moderate narrowing of the left vertebral artery origin secondary to atherosclerotic plaque. The right vertebral artery is occluded at its origin and along its entire cervical length. Course and caliber of the left vertebral artery are normal to the junction with the basilar artery. There is minimal opacification of the right V4 segment secondary to collateral flow. Skeleton: There is no bony spinal canal stenosis. No lytic or blastic lesions. Other neck: The nasopharynx is clear. The oropharynx and hypopharynx are normal. The epiglottis is normal. The supraglottic larynx, glottis and subglottic larynx are normal. No retropharyngeal collection. The parapharyngeal spaces are preserved. The parotid and submandibular glands are normal. No sialolithiasis or salivary ductal dilatation. The thyroid gland is normal. There is no cervical lymphadenopathy. Upper chest: Biapical centrilobular emphysema.  No pleural effusion. Review of the MIP images confirms the above findings CTA HEAD FINDINGS Anterior circulation: --Intracranial internal carotid arteries: There is atherosclerotic calcification of both intracranial internal carotid artery is without severe stenosis. --Anterior cerebral arteries: Normal. --Middle cerebral arteries: There is occlusion of the left MCA M2 superior division. The right MCA is normal. --Posterior communicating arteries: Present on the right. Posterior circulation:  --Posterior cerebral arteries: Normal. --Superior cerebellar arteries: Normal. --Basilar artery: Normal. --Anterior inferior cerebellar arteries: Normal. --Posterior inferior cerebellar arteries: Normal. Venous sinuses: As  permitted by contrast timing, patent. Anatomic variants: Predominantly fetal origin of the right posterior cerebral artery. Delayed phase: Not performed Review of the MIP images confirms the above findings CT Brain Perfusion Findings: CBF (<30%) Volume: 98mL Perfusion (Tmax>6.0s) volume: 234mL Mismatch Volume: 270mL Infarction Location:Left MCA distribution IMPRESSION: 1. Perfusion abnormalities consistent with left MCA infarct with mismatch volume measuring 235 mL. 2. 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 3. 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 Posterior cerebral circulation remains patent. 4. Left carotid system atherosclerotic disease resulting and mild common carotid stenosis, proximal occlusion of the external carotid artery and approximately 50% narrowing of the left internal carotid artery. Critical Value/emergent results were called by telephone at the time of interpretation on 11/28/2016 at 6:35 pm to Dr. Roland Rack , who verbally acknowledged these results. Electronically Signed   By: Ulyses Jarred M.D.   On: 11/28/2016 18:59   Ct Cerebral Perfusion W Contrast  Result Date: 11/28/2016 CLINICAL DATA:  Slurred speech EXAM: CT PERFUSION BRAIN CT angiography head and neck TECHNIQUE: Multiphase CT imaging of the brain was performed following IV bolus contrast injection. Subsequent parametric perfusion maps were calculated using RAPID software. CT angiography of the head and neck were also performed. Stenosis measurements, as reported, are calculated using the NASCET criteria. CONTRAST:  90 mL Isovue 370 IV her COMPARISON:  Head CT 11/28/2016 and 09/12/2011 FINDINGS: CTA NECK FINDINGS Aortic arch:  There is atherosclerotic calcification of the aortic arch with mild-to-moderate narrowing at the arch vessel origins. Proximal subclavian arteries are patent. There is a normal 3 vessel arch configuration. The visualized proximal subclavian arteries are normal. Right carotid system: There is partially calcified plaque at the right carotid bifurcation without hemodynamically significant stenosis. Left carotid system: There is predominantly noncalcified plaque at the proximal left common carotid artery and within the distal left common carotid artery, resulting in approximately 25% narrowing of the arterial lumen. The left external carotid artery is occluded near its origin, with distal reconstitution via collateral pathways. There is predominantly noncalcified plaque along most of the course of the left internal carotid artery resulting in stenosis that measures up to approximately 50%. Vertebral arteries: The vertebral system is left dominant. There is moderate narrowing of the left vertebral artery origin secondary to atherosclerotic plaque. The right vertebral artery is occluded at its origin and along its entire cervical length. Course and caliber of the left vertebral artery are normal to the junction with the basilar artery. There is minimal opacification of the right V4 segment secondary to collateral flow. Skeleton: There is no bony spinal canal stenosis. No lytic or blastic lesions. Other neck: The nasopharynx is clear. The oropharynx and hypopharynx are normal. The epiglottis is normal. The supraglottic larynx, glottis and subglottic larynx are normal. No retropharyngeal collection. The parapharyngeal spaces are preserved. The parotid and submandibular glands are normal. No sialolithiasis or salivary ductal dilatation. The thyroid gland is normal. There is no cervical lymphadenopathy. Upper chest: Biapical centrilobular emphysema.  No pleural effusion. Review of the MIP images confirms the above findings  CTA HEAD FINDINGS Anterior circulation: --Intracranial internal carotid arteries: There is atherosclerotic calcification of both intracranial internal carotid artery is without severe stenosis. --Anterior cerebral arteries: Normal. --Middle cerebral arteries: There is occlusion of the left MCA M2 superior division. The right MCA is normal. --Posterior communicating arteries: Present on the right. Posterior circulation: --Posterior cerebral arteries: Normal. --Superior cerebellar arteries: Normal. --Basilar artery: Normal. --Anterior  inferior cerebellar arteries: Normal. --Posterior inferior cerebellar arteries: Normal. Venous sinuses: As permitted by contrast timing, patent. Anatomic variants: Predominantly fetal origin of the right posterior cerebral artery. Delayed phase: Not performed Review of the MIP images confirms the above findings CT Brain Perfusion Findings: CBF (<30%) Volume: 18mL Perfusion (Tmax>6.0s) volume: 273mL Mismatch Volume: 256mL Infarction Location:Left MCA distribution IMPRESSION: 1. Perfusion abnormalities consistent with left MCA infarct with mismatch volume measuring 235 mL. 2. 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 3. 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 Posterior cerebral circulation remains patent. 4. Left carotid system atherosclerotic disease resulting and mild common carotid stenosis, proximal occlusion of the external carotid artery and approximately 50% narrowing of the left internal carotid artery. Critical Value/emergent results were called by telephone at the time of interpretation on 11/28/2016 at 6:35 pm to Dr. Roland Rack , who verbally acknowledged these results. Electronically Signed   By: Ulyses Jarred M.D.   On: 11/28/2016 18:59   Dg Chest Port 1 View  Result Date: 11/28/2016 CLINICAL DATA:  82 y/o  M; post intubation. EXAM: PORTABLE CHEST 1 VIEW COMPARISON:  08/22/2011 chest  radiograph FINDINGS: Clear lungs. No pleural effusion. Stable cardiac silhouette given projection and technique. Bones are unremarkable. Endotracheal tube is approximately or in 3.6 cm from the carina. IMPRESSION: No acute cardiopulmonary process. Endotracheal tube is approximately 3.6 cm from the carina. Electronically Signed   By: Kristine Garbe M.D.   On: 11/28/2016 23:23   Dg Abd Portable 1v  Result Date: 11/29/2016 CLINICAL DATA:  82 year old male status post enteric tube placement. Initial encounter. EXAM: PORTABLE ABDOMEN - 1 VIEW COMPARISON:  Portable chest 11/28/2016 FINDINGS: Portable AP supine view at 0637 hours. Enteric tube courses to the left upper quadrant, side hole just below the level of the medial diaphragm. Visible mediastinum and lung parenchyma appears stable since yesterday. Gas and stool filled large bowel, no dilated small bowel in the visible abdomen. Left hand artifact over the left lower quadrant. No acute osseous abnormality identified. No definite pneumoperitoneum on this supine view. IMPRESSION: 1. Enteric tube side hole the level of the gastroesophageal junction. Advance 5-6 cm for more optimal placement. 2. Gas and stool filled large bowel without strong evidence of mechanical bowel obstruction at this time. Electronically Signed   By: Genevie Ann M.D.   On: 11/29/2016 08:35   Ct Head Code Stroke Wo Contrast`  Addendum Date: 11/28/2016   ADDENDUM REPORT: 11/28/2016 23:11 ADDENDUM: Though findings could represent contrast staining, this is an atypical appearance for said entity. Acute findings discussed with and reconfirmed by Dr.LINDZEN on 11/28/2016 at 11:00 pm. Electronically Signed   By: Elon Alas M.D.   On: 11/28/2016 23:11   Result Date: 11/28/2016 CLINICAL DATA:  Code stroke. Status post revascularization and clot retrieval LEFT MCA. 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 EXAM: CT HEAD WITHOUT CONTRAST TECHNIQUE: Contiguous  axial images were obtained from the base of the skull through the vertex without intravenous contrast. COMPARISON:  None. FINDINGS: BRAIN: Intravascular contrast limits evaluation of intracranial hemorrhage. No lobar hematoma, midline shift or mass effect. Faint enhancement LEFT temporal lobe. Patchy to confluent supratentorial white matter hypodensities. Old bilateral basal ganglia lacunar infarcts. No abnormal extra-axial fluid collections. Basal cisterns are patent. VASCULAR: Moderate calcific atherosclerosis of the carotid siphons. Contrast opacifies LEFT middle cerebral artery. SKULL: No skull fracture. LEFT sphenoid intraosseous lipoma. Probable bone island RIGHT superolateral orbital  wall. No significant scalp soft tissue swelling. SINUSES/ORBITS: Trace LEFT mastoid effusion. Mild paranasal sinus mucosal thickening without air-fluid levels. The included ocular globes and orbital contents are non-suspicious. OTHER: Patient is edentulous.  Life-support lines in place. ASPECTS [facility] Stroke Program Early CT Score) - Ganglionic level infarction (caudate, lentiform nuclei, internal capsule, insula, M1-M3 cortex): 7 - Supraganglionic infarction (M4-M6 cortex): 3 Total score (0-10 with 10 being normal): 10 IMPRESSION: 1. Status post endovascular revascularization LEFT MCA, faint enhancing LEFT temporal lobe consistent with collateral vessels in the setting of acute stroke, or, can be seen in subacute infarcts. Moderate to severe chronic small vessel ischemic disease, old bilateral basal ganglia lacunar infarcts. 2. ASPECTS is 10 though, limited by presence of intravascular contrast. Referring clinician being paged by the Radiology assistant, pending return call. Electronically Signed: By: Elon Alas M.D. On: 11/28/2016 22:47   Ct Head Code Stroke Wo Contrast`  Result Date: 11/28/2016 CLINICAL DATA:  Code stroke.  Slurred speech EXAM: CT HEAD WITHOUT CONTRAST TECHNIQUE: Contiguous axial images were  obtained from the base of the skull through the vertex without intravenous contrast. COMPARISON:  Head CT 09/12/2011 FINDINGS: Brain: No mass lesion, intraparenchymal hemorrhage or extra-axial collection. No evidence of acute cortical infarct. There is loss encephalomalacia in the right parietal and occipital lobes, compatible with prior infarcts. There is an old right lenticular capsular lacunar infarct. There is periventricular hypoattenuation compatible with chronic microvascular disease. Vascular: Mild calcification of the internal carotid arteries at the skullbase. Skull: Normal visualized skull base, calvarium and extracranial soft tissues. Sinuses/Orbits: Partial opacification of the left mastoid air cells. Normal orbits. ASPECTS [facility] Stroke Program Early CT Score) - Ganglionic level infarction (caudate, lentiform nuclei, internal capsule, insula, M1-M3 cortex): 7 - Supraganglionic infarction (M4-M6 cortex): 3 Total score (0-10 with 10 being normal): 10 IMPRESSION: 1. No acute intracranial hemorrhage. Findings of multiple old infarcts and chronic microvascular ischemia. 2. ASPECTS is 10. These results were called by telephone at the time of interpretation on 11/28/2016 at 6:21 pm to Dr. Kathrynn Speed , who verbally acknowledged these results. Electronically Signed   By: Ulyses Jarred M.D.   On: 11/28/2016 18:22    Labs:  CBC:  Recent Labs  03/23/16 1830 03/24/16 0440 11/28/16 1800 11/28/16 1810 11/29/16 0505  WBC 14.4* 13.8* 12.0*  --  16.4*  HGB 13.5 12.9* 16.8 17.3* 14.3  HCT 42.5 40.8 50.7 51.0 44.0  PLT 399 418* 400  --  368    COAGS:  Recent Labs  03/16/16 1554 11/28/16 1800  INR 1.15 1.04  APTT 39* 39*    BMP:  Recent Labs  03/16/16 1554 03/23/16 1830 03/24/16 0440 11/28/16 1800 11/28/16 1810 11/29/16 0505  NA 141  --  138 137 136 136  K 4.0  --  4.1 4.5 4.2 3.8  CL 108  --  103 100* 101 103  CO2 21*  --  24 25  --  25  GLUCOSE 90  --  113* 98 102*  120*  BUN 18  --  21* 20 25* 16  CALCIUM 9.1  --  8.4* 9.4  --  8.2*  CREATININE 1.43* 1.30* 1.17 1.49* 1.40* 1.34*  GFRNONAA 45* 51* 57* 43*  --  48*  GFRAA 52* 59* >60 49*  --  56*    LIVER FUNCTION TESTS:  Recent Labs  03/16/16 1554 11/28/16 1800  BILITOT 0.7 0.5  AST 26 26  ALT 15* 14*  ALKPHOS 77 81  PROT 7.3 7.2  ALBUMIN 3.8 4.1    Assessment and Plan:  CVA L MCA revascularization 12/11 in IR Will follow For sheath removal asap For MRI after removal  Electronically Signed: Taya Ashbaugh A 11/29/2016, 9:41 AM   I spent a total of 15 Minutes at the the patient's bedside AND on the patient's hospital floor or unit, greater than 50% of which was counseling/coordinating care for CVA; L MCA Revasc

## 2016-11-29 NOTE — Progress Notes (Signed)
PCCM Progress Note  Admission date: 11/28/2016 Consult date: 11/28/2016 Referring provider: Dr. Leonel Ramsay  CC: Aphasia  HPI: 82 yo male presented to ER with aphasia, facial droop, dysarthria.  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  He had neuro IR intervention, and remained on vent post procedure.  PMHx of Depression, GERD, PAD  Subjective: Tolerating SBT  Vital signs:  BP (!) 132/58   Pulse 97   Temp 99.7 F (37.6 C) (Axillary)   Resp (!) 21   Ht 5\' 11"  (1.803 m)   Wt 160 lb 4.4 oz (72.7 kg)   SpO2 99%   BMI 22.35 kg/m   Intake/output: I/O last 3 completed shifts: In: 3351.9 [I.V.:3301.9; IV Piggyback:50] Out: 87 [Urine:800; Blood:20]  General: sedated Neuro: RASS -1 HEENT: ETT in place Cardiac: regular, tachycardic Chest: no wheeze Abd: soft, non tender Ext: no edema Skin: no rashes   CMP Latest Ref Rng & Units 11/29/2016 11/28/2016 11/28/2016  Glucose 65 - 99 mg/dL 120(H) 102(H) 98  BUN 6 - 20 mg/dL 16 25(H) 20  Creatinine 0.61 - 1.24 mg/dL 1.34(H) 1.40(H) 1.49(H)  Sodium 135 - 145 mmol/L 136 136 137  Potassium 3.5 - 5.1 mmol/L 3.8 4.2 4.5  Chloride 101 - 111 mmol/L 103 101 100(L)  CO2 22 - 32 mmol/L 25 - 25  Calcium 8.9 - 10.3 mg/dL 8.2(L) - 9.4  Total Protein 6.5 - 8.1 g/dL - - 7.2  Total Bilirubin 0.3 - 1.2 mg/dL - - 0.5  Alkaline Phos 38 - 126 U/L - - 81  AST 15 - 41 U/L - - 26  ALT 17 - 63 U/L - - 14(L)    CBC Latest Ref Rng & Units 11/29/2016 11/28/2016 11/28/2016  WBC 4.0 - 10.5 K/uL 16.4(H) - 12.0(H)  Hemoglobin 13.0 - 17.0 g/dL 14.3 17.3(H) 16.8  Hematocrit 39.0 - 52.0 % 44.0 51.0 50.7  Platelets 150 - 400 K/uL 368 - 400    ABG    Component Value Date/Time   PHART 7.464 (H) 11/29/2016 0021   PCO2ART 35.3 11/29/2016 0021   PO2ART 393 (H) 11/29/2016 0021   HCO3 25.0 11/29/2016 0021   TCO2 24 11/28/2016 1810   O2SAT 99.6 11/29/2016 0021    CBG (last 3)   Recent Labs  11/29/16 0145 11/29/16 0342 11/29/16 0802  GLUCAP 109* 121* 129*     Imaging: Ct Angio Head W Or Wo Contrast  Result Date: 11/28/2016 CLINICAL DATA:  Slurred speech EXAM: CT PERFUSION BRAIN CT angiography head and neck TECHNIQUE: Multiphase CT imaging of the brain was performed following IV bolus contrast injection. Subsequent parametric perfusion maps were calculated using RAPID software. CT angiography of the head and neck were also performed. Stenosis measurements, as reported, are calculated using the NASCET criteria. CONTRAST:  90 mL Isovue 370 IV her COMPARISON:  Head CT 11/28/2016 and 09/12/2011 FINDINGS: CTA NECK FINDINGS Aortic arch: There is atherosclerotic calcification of the aortic arch with mild-to-moderate narrowing at the arch vessel origins. Proximal subclavian arteries are patent. There is a normal 3 vessel arch configuration. The visualized proximal subclavian arteries are normal. Right carotid system: There is partially calcified plaque at the right carotid bifurcation without hemodynamically significant stenosis. Left carotid system: There is predominantly noncalcified plaque at the proximal left common carotid artery and within the distal left common carotid artery, resulting in approximately 25% narrowing of the arterial lumen. The left external carotid artery is occluded near its origin, with distal reconstitution via collateral pathways. There is predominantly noncalcified plaque along  most of the course of the left internal carotid artery resulting in stenosis that measures up to approximately 50%. Vertebral arteries: The vertebral system is left dominant. There is moderate narrowing of the left vertebral artery origin secondary to atherosclerotic plaque. The right vertebral artery is occluded at its origin and along its entire cervical length. Course and caliber of the left vertebral artery are normal to the junction with the basilar artery. There is minimal opacification of the right V4 segment secondary to collateral flow. Skeleton: There is no  bony spinal canal stenosis. No lytic or blastic lesions. Other neck: The nasopharynx is clear. The oropharynx and hypopharynx are normal. The epiglottis is normal. The supraglottic larynx, glottis and subglottic larynx are normal. No retropharyngeal collection. The parapharyngeal spaces are preserved. The parotid and submandibular glands are normal. No sialolithiasis or salivary ductal dilatation. The thyroid gland is normal. There is no cervical lymphadenopathy. Upper chest: Biapical centrilobular emphysema.  No pleural effusion. Review of the MIP images confirms the above findings CTA HEAD FINDINGS Anterior circulation: --Intracranial internal carotid arteries: There is atherosclerotic calcification of both intracranial internal carotid artery is without severe stenosis. --Anterior cerebral arteries: Normal. --Middle cerebral arteries: There is occlusion of the left MCA M2 superior division. The right MCA is normal. --Posterior communicating arteries: Present on the right. Posterior circulation: --Posterior cerebral arteries: Normal. --Superior cerebellar arteries: Normal. --Basilar artery: Normal. --Anterior inferior cerebellar arteries: Normal. --Posterior inferior cerebellar arteries: Normal. Venous sinuses: As permitted by contrast timing, patent. Anatomic variants: Predominantly fetal origin of the right posterior cerebral artery. Delayed phase: Not performed Review of the MIP images confirms the above findings CT Brain Perfusion Findings: CBF (<30%) Volume: 66mL Perfusion (Tmax>6.0s) volume: 221mL Mismatch Volume: 257mL Infarction Location:Left MCA distribution IMPRESSION: 1. Perfusion abnormalities consistent with left MCA infarct with mismatch volume measuring 235 mL. 2. 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 3. 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 Posterior cerebral circulation remains patent. 4. Left carotid system  atherosclerotic disease resulting and mild common carotid stenosis, proximal occlusion of the external carotid artery and approximately 50% narrowing of the left internal carotid artery. Critical Value/emergent results were called by telephone at the time of interpretation on 11/28/2016 at 6:35 pm to Dr. Roland Rack , who verbally acknowledged these results. Electronically Signed   By: Ulyses Jarred M.D.   On: 11/28/2016 18:59   Ct Head Wo Contrast  Result Date: 11/29/2016 CLINICAL DATA:  82 y/o  M; stroke for follow-up. EXAM: CT HEAD WITHOUT CONTRAST TECHNIQUE: Contiguous axial images were obtained from the base of the skull through the vertex without intravenous contrast. COMPARISON:  11/28/2016 CT of the head. FINDINGS: Brain: Cortical hypoattenuation involving the left insular and frontal operculum compatible with evolving infarction. Background of moderate to severe chronic microvascular ischemic changes and mild parenchymal volume loss similar to prior study. Vascular: Persistent contrast opacification of the blood pool. Moderate calcification of the cavernous internal carotid arteries. Skull: Normal. Negative for fracture or focal lesion. Sinuses/Orbits: Right frontal sinus mucous retention cyst. Partial opacification of left mastoid air cell, probably related to intubation. Bilateral intra-ocular lens replacement. Other: None. IMPRESSION: 1. Cortical hypoattenuation with mild local mass effect involving the left insula and frontal operculum compatible with infarction. No intracranial hemorrhage identified. 2. Stable background of moderate to severe chronic microvascular ischemic changes and mild brain parenchymal volume loss. These results will be called to the ordering clinician or representative by the  Psychologist, clinical, and communication documented in the PACS or zVision Dashboard. Electronically Signed   By: Kristine Garbe M.D.   On: 11/29/2016 06:30   Ct Angio Neck W And/or  Wo Contrast  Result Date: 11/28/2016 CLINICAL DATA:  Slurred speech EXAM: CT PERFUSION BRAIN CT angiography head and neck TECHNIQUE: Multiphase CT imaging of the brain was performed following IV bolus contrast injection. Subsequent parametric perfusion maps were calculated using RAPID software. CT angiography of the head and neck were also performed. Stenosis measurements, as reported, are calculated using the NASCET criteria. CONTRAST:  90 mL Isovue 370 IV her COMPARISON:  Head CT 11/28/2016 and 09/12/2011 FINDINGS: CTA NECK FINDINGS Aortic arch: There is atherosclerotic calcification of the aortic arch with mild-to-moderate narrowing at the arch vessel origins. Proximal subclavian arteries are patent. There is a normal 3 vessel arch configuration. The visualized proximal subclavian arteries are normal. Right carotid system: There is partially calcified plaque at the right carotid bifurcation without hemodynamically significant stenosis. Left carotid system: There is predominantly noncalcified plaque at the proximal left common carotid artery and within the distal left common carotid artery, resulting in approximately 25% narrowing of the arterial lumen. The left external carotid artery is occluded near its origin, with distal reconstitution via collateral pathways. There is predominantly noncalcified plaque along most of the course of the left internal carotid artery resulting in stenosis that measures up to approximately 50%. Vertebral arteries: The vertebral system is left dominant. There is moderate narrowing of the left vertebral artery origin secondary to atherosclerotic plaque. The right vertebral artery is occluded at its origin and along its entire cervical length. Course and caliber of the left vertebral artery are normal to the junction with the basilar artery. There is minimal opacification of the right V4 segment secondary to collateral flow. Skeleton: There is no bony spinal canal stenosis. No  lytic or blastic lesions. Other neck: The nasopharynx is clear. The oropharynx and hypopharynx are normal. The epiglottis is normal. The supraglottic larynx, glottis and subglottic larynx are normal. No retropharyngeal collection. The parapharyngeal spaces are preserved. The parotid and submandibular glands are normal. No sialolithiasis or salivary ductal dilatation. The thyroid gland is normal. There is no cervical lymphadenopathy. Upper chest: Biapical centrilobular emphysema.  No pleural effusion. Review of the MIP images confirms the above findings CTA HEAD FINDINGS Anterior circulation: --Intracranial internal carotid arteries: There is atherosclerotic calcification of both intracranial internal carotid artery is without severe stenosis. --Anterior cerebral arteries: Normal. --Middle cerebral arteries: There is occlusion of the left MCA M2 superior division. The right MCA is normal. --Posterior communicating arteries: Present on the right. Posterior circulation: --Posterior cerebral arteries: Normal. --Superior cerebellar arteries: Normal. --Basilar artery: Normal. --Anterior inferior cerebellar arteries: Normal. --Posterior inferior cerebellar arteries: Normal. Venous sinuses: As permitted by contrast timing, patent. Anatomic variants: Predominantly fetal origin of the right posterior cerebral artery. Delayed phase: Not performed Review of the MIP images confirms the above findings CT Brain Perfusion Findings: CBF (<30%) Volume: 72mL Perfusion (Tmax>6.0s) volume: 281mL Mismatch Volume: 260mL Infarction Location:Left MCA distribution IMPRESSION: 1. Perfusion abnormalities consistent with left MCA infarct with mismatch volume measuring 235 mL. 2. 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 3. 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 Posterior cerebral circulation remains patent. 4. Left carotid system atherosclerotic disease resulting and  mild common carotid stenosis, proximal occlusion of the external carotid artery and approximately 50% narrowing of the left internal carotid artery. Critical  Value/emergent results were called by telephone at the time of interpretation on 11/28/2016 at 6:35 pm to Dr. Roland Rack , who verbally acknowledged these results. Electronically Signed   By: Ulyses Jarred M.D.   On: 11/28/2016 18:59   Ct Cerebral Perfusion W Contrast  Result Date: 11/28/2016 CLINICAL DATA:  Slurred speech EXAM: CT PERFUSION BRAIN CT angiography head and neck TECHNIQUE: Multiphase CT imaging of the brain was performed following IV bolus contrast injection. Subsequent parametric perfusion maps were calculated using RAPID software. CT angiography of the head and neck were also performed. Stenosis measurements, as reported, are calculated using the NASCET criteria. CONTRAST:  90 mL Isovue 370 IV her COMPARISON:  Head CT 11/28/2016 and 09/12/2011 FINDINGS: CTA NECK FINDINGS Aortic arch: There is atherosclerotic calcification of the aortic arch with mild-to-moderate narrowing at the arch vessel origins. Proximal subclavian arteries are patent. There is a normal 3 vessel arch configuration. The visualized proximal subclavian arteries are normal. Right carotid system: There is partially calcified plaque at the right carotid bifurcation without hemodynamically significant stenosis. Left carotid system: There is predominantly noncalcified plaque at the proximal left common carotid artery and within the distal left common carotid artery, resulting in approximately 25% narrowing of the arterial lumen. The left external carotid artery is occluded near its origin, with distal reconstitution via collateral pathways. There is predominantly noncalcified plaque along most of the course of the left internal carotid artery resulting in stenosis that measures up to approximately 50%. Vertebral arteries: The vertebral system is left dominant. There is  moderate narrowing of the left vertebral artery origin secondary to atherosclerotic plaque. The right vertebral artery is occluded at its origin and along its entire cervical length. Course and caliber of the left vertebral artery are normal to the junction with the basilar artery. There is minimal opacification of the right V4 segment secondary to collateral flow. Skeleton: There is no bony spinal canal stenosis. No lytic or blastic lesions. Other neck: The nasopharynx is clear. The oropharynx and hypopharynx are normal. The epiglottis is normal. The supraglottic larynx, glottis and subglottic larynx are normal. No retropharyngeal collection. The parapharyngeal spaces are preserved. The parotid and submandibular glands are normal. No sialolithiasis or salivary ductal dilatation. The thyroid gland is normal. There is no cervical lymphadenopathy. Upper chest: Biapical centrilobular emphysema.  No pleural effusion. Review of the MIP images confirms the above findings CTA HEAD FINDINGS Anterior circulation: --Intracranial internal carotid arteries: There is atherosclerotic calcification of both intracranial internal carotid artery is without severe stenosis. --Anterior cerebral arteries: Normal. --Middle cerebral arteries: There is occlusion of the left MCA M2 superior division. The right MCA is normal. --Posterior communicating arteries: Present on the right. Posterior circulation: --Posterior cerebral arteries: Normal. --Superior cerebellar arteries: Normal. --Basilar artery: Normal. --Anterior inferior cerebellar arteries: Normal. --Posterior inferior cerebellar arteries: Normal. Venous sinuses: As permitted by contrast timing, patent. Anatomic variants: Predominantly fetal origin of the right posterior cerebral artery. Delayed phase: Not performed Review of the MIP images confirms the above findings CT Brain Perfusion Findings: CBF (<30%) Volume: 8mL Perfusion (Tmax>6.0s) volume: 211mL Mismatch Volume: 272mL  Infarction Location:Left MCA distribution IMPRESSION: 1. Perfusion abnormalities consistent with left MCA infarct with mismatch volume measuring 235 mL. 2. 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 3. 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 Posterior cerebral circulation remains patent. 4. Left carotid system atherosclerotic disease resulting and mild common carotid stenosis, proximal occlusion of the external carotid  artery and approximately 50% narrowing of the left internal carotid artery. Critical Value/emergent results were called by telephone at the time of interpretation on 11/28/2016 at 6:35 pm to Dr. Roland Rack , who verbally acknowledged these results. Electronically Signed   By: Ulyses Jarred M.D.   On: 11/28/2016 18:59   Dg Chest Port 1 View  Result Date: 11/28/2016 CLINICAL DATA:  82 y/o  M; post intubation. EXAM: PORTABLE CHEST 1 VIEW COMPARISON:  08/22/2011 chest radiograph FINDINGS: Clear lungs. No pleural effusion. Stable cardiac silhouette given projection and technique. Bones are unremarkable. Endotracheal tube is approximately or in 3.6 cm from the carina. IMPRESSION: No acute cardiopulmonary process. Endotracheal tube is approximately 3.6 cm from the carina. Electronically Signed   By: Kristine Garbe M.D.   On: 11/28/2016 23:23   Dg Abd Portable 1v  Result Date: 11/29/2016 CLINICAL DATA:  82 year old male status post enteric tube placement. Initial encounter. EXAM: PORTABLE ABDOMEN - 1 VIEW COMPARISON:  Portable chest 11/28/2016 FINDINGS: Portable AP supine view at 0637 hours. Enteric tube courses to the left upper quadrant, side hole just below the level of the medial diaphragm. Visible mediastinum and lung parenchyma appears stable since yesterday. Gas and stool filled large bowel, no dilated small bowel in the visible abdomen. Left hand artifact over the left lower quadrant. No acute  osseous abnormality identified. No definite pneumoperitoneum on this supine view. IMPRESSION: 1. Enteric tube side hole the level of the gastroesophageal junction. Advance 5-6 cm for more optimal placement. 2. Gas and stool filled large bowel without strong evidence of mechanical bowel obstruction at this time. Electronically Signed   By: Genevie Ann M.D.   On: 11/29/2016 08:35   Ct Head Code Stroke Wo Contrast`  Addendum Date: 11/28/2016   ADDENDUM REPORT: 11/28/2016 23:11 ADDENDUM: Though findings could represent contrast staining, this is an atypical appearance for said entity. Acute findings discussed with and reconfirmed by Dr.LINDZEN on 11/28/2016 at 11:00 pm. Electronically Signed   By: Elon Alas M.D.   On: 11/28/2016 23:11   Result Date: 11/28/2016 CLINICAL DATA:  Code stroke. Status post revascularization and clot retrieval LEFT MCA. 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 EXAM: CT HEAD WITHOUT CONTRAST TECHNIQUE: Contiguous axial images were obtained from the base of the skull through the vertex without intravenous contrast. COMPARISON:  None. FINDINGS: BRAIN: Intravascular contrast limits evaluation of intracranial hemorrhage. No lobar hematoma, midline shift or mass effect. Faint enhancement LEFT temporal lobe. Patchy to confluent supratentorial white matter hypodensities. Old bilateral basal ganglia lacunar infarcts. No abnormal extra-axial fluid collections. Basal cisterns are patent. VASCULAR: Moderate calcific atherosclerosis of the carotid siphons. Contrast opacifies LEFT middle cerebral artery. SKULL: No skull fracture. LEFT sphenoid intraosseous lipoma. Probable bone island RIGHT superolateral orbital wall. No significant scalp soft tissue swelling. SINUSES/ORBITS: Trace LEFT mastoid effusion. Mild paranasal sinus mucosal thickening without air-fluid levels. The included ocular globes and orbital contents are non-suspicious. OTHER: Patient is edentulous.   Life-support lines in place. ASPECTS [facility] Stroke Program Early CT Score) - Ganglionic level infarction (caudate, lentiform nuclei, internal capsule, insula, M1-M3 cortex): 7 - Supraganglionic infarction (M4-M6 cortex): 3 Total score (0-10 with 10 being normal): 10 IMPRESSION: 1. Status post endovascular revascularization LEFT MCA, faint enhancing LEFT temporal lobe consistent with collateral vessels in the setting of acute stroke, or, can be seen in subacute infarcts. Moderate to severe chronic small vessel ischemic disease, old bilateral basal ganglia lacunar infarcts. 2. ASPECTS is 10 though, limited by  presence of intravascular contrast. Referring clinician being paged by the Radiology assistant, pending return call. Electronically Signed: By: Elon Alas M.D. On: 11/28/2016 22:47   Ct Head Code Stroke Wo Contrast`  Result Date: 11/28/2016 CLINICAL DATA:  Code stroke.  Slurred speech EXAM: CT HEAD WITHOUT CONTRAST TECHNIQUE: Contiguous axial images were obtained from the base of the skull through the vertex without intravenous contrast. COMPARISON:  Head CT 09/12/2011 FINDINGS: Brain: No mass lesion, intraparenchymal hemorrhage or extra-axial collection. No evidence of acute cortical infarct. There is loss encephalomalacia in the right parietal and occipital lobes, compatible with prior infarcts. There is an old right lenticular capsular lacunar infarct. There is periventricular hypoattenuation compatible with chronic microvascular disease. Vascular: Mild calcification of the internal carotid arteries at the skullbase. Skull: Normal visualized skull base, calvarium and extracranial soft tissues. Sinuses/Orbits: Partial opacification of the left mastoid air cells. Normal orbits. ASPECTS [name] Stroke Program Early CT Score) - Ganglionic level infarction (caudate, lentiform nuclei, internal capsule, insula, M1-M3 cortex): 7 - Supraganglionic infarction (M4-M6 cortex): 3 Total score (0-10 with 10  being normal): 10 IMPRESSION: 1. No acute intracranial hemorrhage. Findings of multiple old infarcts and chronic microvascular ischemia. 2. ASPECTS is 10. These results were called by telephone at the time of interpretation on 11/28/2016 at 6:21 pm to Dr. Kathrynn Speed , who verbally acknowledged these results. Electronically Signed   By: Ulyses Jarred M.D.   On: 11/28/2016 18:22   Studies:  Events: 12/11 Admit, cerebral angiogram >> revascularization of Lt MCA  Lines/tubes: ETT 12/11 >>   Assessment/plan:  Acute CVA. - f/u MRI brain - IR to remove sheaths 12/12  Compromised airway in setting of CVA. - pressure support wean  - likely extubated after sheaths out and MRI brain done  Hx of HTN. - goal SBP 120 to 140 per neuro IR - cardene gtt  DVT prophylaxis - SCDs SUP - protonix Nutrition - NPO Goals of care - full code  Updated pt's family at bedside  D/w Dr. Leonie Man  CC time 33 minutes.  Chesley Mires, MD [facility] Pulmonary/Critical Care 11/29/2016, 9:57 AM Pager:  [phone] After 3pm call: [phone]

## 2016-11-29 NOTE — Procedures (Signed)
Pt transported from [id number] to CT then back to room without complications.

## 2016-11-29 NOTE — Progress Notes (Signed)
21fr sheath pulled from left groin at 10:54am, no hematoma present before sheath pull, exoseal NOT used due to clogged sheath, pressure held for 30 minutes and hemostasis achieved at [id number] am.  No complications.  Left groin reviewed with Tammy RN.   Melvyn Neth R.T. (R) (VI) Talea Manges R.T. (R)

## 2016-11-29 NOTE — Progress Notes (Signed)
  Echocardiogram 2D Echocardiogram has been performed.  [name] 11/29/2016, 3:36 PM

## 2016-11-29 NOTE — Progress Notes (Signed)
STROKE TEAM PROGRESS NOTE   HISTORY OF PRESENT ILLNESS (per record) [name] is a 82 y.o. male with a history of carotid endarterectomy on the left in April who presents with aphasia. He last spoke to someone around 1 PM, and subsequently was noticed by his family that he was speaking nonsensical statements. On arrival, he had some facial droop, aphasia and dysarthria. In the emergency department he did wax and wane, improving some and then worsening at times.  Due to his symptoms, a CT perfusion was obtained which did demonstrate a very large penumbra with an M2 occlusion.  Dr. Leonel Ramsay had a long discussion with family, there was some delay in decision-making due to the time spent discussing it with family and discussion amongst the family. After presenting options of IR vs conservative management in the ICU the family agreed to proceed with intervention.  He was LKW at  1 PM 11/28/2016. Premorbid Modified Rankin score: 1. Patient was not administered IV t-PA secondary to being out of window. He was taken to IR were he received TICI 2b revascularization of L MCA with solitaire and 7mg  IA integrelin. Also found to have mod severe diffuse intracranial atherosclerosis, L VA 85-90% stenosis, occluded L ECA, and L VBJ 60% stenosis.  He was admitted to the neuro ICU for further evaluation and treatment.   SUBJECTIVE (INTERVAL HISTORY) Patient remains intubated. His daughter is at the bedside. Blood pressure has been adequately controlled. CT head from this morning shows surgical signs of ischemia in the left insula and frontal operculum but no hemorrhage  OBJECTIVE Temp:  [97.5 F (36.4 C)-99.7 F (37.6 C)] 99.7 F (37.6 C) (12/12 0800) Pulse Rate:  [55-121] 103 (12/12 0900) Cardiac Rhythm: Sinus tachycardia (12/12 0800) Resp:  [12-22] 20 (12/12 0900) BP: (90-233)/(51-121) 132/58 (12/12 0900) SpO2:  [96 %-100 %] 99 % (12/12 0900) Arterial Line BP: (103-152)/(45-66) 120/48 (12/12  0900) FiO2 (%):  [40 %-100 %] 40 % (12/12 0800) Weight:  [72.7 kg (160 lb 4.4 oz)-73.9 kg (162 lb 14.7 oz)] 72.7 kg (160 lb 4.4 oz) (12/12 0000)  CBC:  Recent Labs Lab 11/28/16 1800 11/28/16 1810 11/29/16 0505  WBC 12.0*  --  16.4*  NEUTROABS 9.0*  --  14.3*  HGB 16.8 17.3* 14.3  HCT 50.7 51.0 44.0  MCV 91.7  --  90.2  PLT 400  --  [id number]    Basic Metabolic Panel:  Recent Labs Lab 11/28/16 1800 11/28/16 1810 11/29/16 0505  NA 137 136 136  K 4.5 4.2 3.8  CL 100* 101 103  CO2 25  --  25  GLUCOSE 98 102* 120*  BUN 20 25* 16  CREATININE 1.49* 1.40* 1.34*  CALCIUM 9.4  --  8.2*    Lipid Panel:    Component Value Date/Time   CHOL 156 11/29/2016 0505   TRIG 154 (H) 11/29/2016 0505   HDL 37 (L) 11/29/2016 0505   CHOLHDL 4.2 11/29/2016 0505   VLDL 31 11/29/2016 0505   LDLCALC 88 11/29/2016 0505   HgbA1c: No results found for: HGBA1C Urine Drug Screen: No results found for: LABOPIA, COCAINSCRNUR, LABBENZ, AMPHETMU, THCU, LABBARB    IMAGING  Ct Head Code Stroke Wo Contrast` Addendum - Though findings could represent contrast staining, this is an atypical appearance for said entity.  11/28/2016 -  1. Status post endovascular revascularization LEFT MCA, faint enhancing LEFT temporal lobe consistent with collateral vessels in the setting of acute stroke, or, can be seen in subacute infarcts. Moderate  to severe chronic small vessel ischemic disease, old bilateral basal ganglia lacunar infarcts. 2. ASPECTS is 10 though, limited by presence of intravascular contrast.   Ct Head Code Stroke Wo Contrast` 11/28/2016 1. No acute intracranial hemorrhage. Findings of multiple old infarcts and chronic microvascular ischemia. 2. ASPECTS is 10.   Ct Angio Head W Or Wo Contrast Ct Angio Neck W And/or Wo Contrast Ct Cerebral Perfusion W Contrast 11/28/2016 1. Perfusion abnormalities consistent with left MCA infarct with mismatch volume measuring 235 mL. 2. Complete occlusion of the left  middle cerebral artery M2 segment superior division. 3. 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 Posterior cerebral circulation remains patent. 4. Left carotid system atherosclerotic disease resulting and mild common carotid stenosis, proximal occlusion of the external carotid artery and approximately 50% narrowing of the left internal carotid artery.   Ct Head Wo Contrast 11/29/2016 1. Cortical hypoattenuation with mild local mass effect involving the left insula and frontal operculum compatible with infarction. No intracranial hemorrhage identified. 2. Stable background of moderate to severe chronic microvascular ischemic changes and mild brain parenchymal volume loss.   Dg Chest Port 1 View 11/28/2016 No acute cardiopulmonary process. Endotracheal tube is approximately 3.6 cm from the carina.   Dg Abd Portable 1v 11/29/2016 1. Enteric tube side hole the level of the gastroesophageal junction. Advance 5-6 cm for more optimal placement. 2. Gas and stool filled large bowel without strong evidence of mechanical bowel obstruction at this time.    PHYSICAL EXAM Elderly Caucasian male who was intubated and sedated on propofol. . Afebrile. Head is nontraumatic. Neck is supple without bruit.    Cardiac exam no murmur or gallop. Lungs are clear to auscultation. Distal pulses are well felt. Neurological Exam : Patient is intubated and lightly sedated with propofol. Patient does not follow any commands. Eyes are closed. Pupils 4 mm irregular but reactive. Left gaze preference. Blinks to threat on the left but not the right. Right lower facial weakness. Dense right hemiplegia. Will not withdraw to painful stimuli in the right arm but with so slightly in the right leg. Purposeful antigravity movements on the left. Right plantar upgoing left downgoing. ASSESSMENT/PLAN [name] is a 82 y.o. male with history of L CEA and mild memory loss presenting with aphasia,  facial droop and dysarthria. He did not receive IV t-PA due to delay in arrival. He was sent to IR where he received TICI 2b revascularization of L MCA with solitaire and IA integrelin    Stroke:  Left MCA infarct s/p TICI 2b revascularization of L MCA with solitaire and IA integrelin. infarct embolic secondary to unknown source   Resultant  right hemiparesis and aphasia  CTA head and neck/perfusion - left MCA mismatch. Complete L M2 occlusion. Complete R VA occlusion. L ICA atherosclerosis with proximal occlusion of the ECA and L ICA 50% narrowing   Cerebral angio TICI 2b revascularization of L MCA with solitaire and IA integrelin. mod severe diffuse intracranial atherosclerosis, L VA 85-90% stenosis, occluded L ECA, and L VBJ 60% stenosis.   Post intervention CT no hemorrhage. Small vessel disease. Left insula and frontal infarct  MRI  pending  2D Echo  pending  LDL 88  HgbA1c pending  SCDs for VTE prophylaxis  Diet NPO time specified  No antithrombotic listed on med rec prior to admission, now on No antithrombotic. Add aspirin suppositoryas  Ongoing aggressive stroke risk factor management  Therapy recommendations:  pending  Disposition:  pending   Hyperlipidemia  Home meds:  No statin  LDL 88, goal < 70  Add statin once able to swallow  Continue statin at discharge  Other Stroke Risk Factors  Advanced age  Former Cigarette smoker  PAD  History carotid stenosis status post left CEA 03/2016  Other Active Problems  Baseline mild memory deficit, followed by Dr. Leonie Man as outpatient   History of depression, on Celexa in the past but nothing currently  Hospital day # 1 I have personally examined this patient, reviewed notes, independently viewed imaging studies, participated in medical decision making and plan of care.ROS completed by me personally and pertinent positives fully documented  I have made any additions or clarifications directly to the above note.  I had a long discussion the patient's daughter at the bedside and explained his plan of care, prognosis and answered questions. Recommend strict blood pressure control and close neurological monitoring. Discontinuing groin arterial sheath today. Extubate later today if possible. Check MRI scan of the brain. Continue ongoing stroke workup. Start aspirin for stroke prevention. Discussed with Dr. Halford Chessman  This patient is critically ill and at significant risk of neurological worsening, death and care requires constant monitoring of vital signs, hemodynamics,respiratory and cardiac monitoring, extensive review of multiple databases, frequent neurological assessment, discussion with family, other specialists and medical decision making of high complexity.I have made any additions or clarifications directly to the above note.This critical care time does not reflect procedure time, or teaching time or supervisory time of PA/NP/Med Resident etc but could involve care discussion time.  I spent 30 minutes of neurocritical care time  in the care of  this patient.      Antony Contras, MD Medical Director [facility] Pager: [phone] 11/29/2016 1:46 PM    To contact Stroke Continuity provider, please refer to [email or web address] After hours, contact General Neurology

## 2016-11-29 NOTE — Anesthesia Postprocedure Evaluation (Signed)
Anesthesia Post Note  Patient: [name]  Procedure(s) Performed: Procedure(s) (LRB): RADIOLOGY WITH ANESTHESIA (N/A)  Patient location during evaluation: ICU Anesthesia Type: General Level of consciousness: sedated Pain management: pain level controlled Vital Signs Assessment: post-procedure vital signs reviewed and stable Respiratory status: patient remains intubated per anesthesia plan Cardiovascular status: stable Anesthetic complications: no    Last Vitals:  Vitals:   11/29/16 0045 11/29/16 0100  BP:  136/70  Pulse: 70 80  Resp: 14 14  Temp:      Last Pain:  Vitals:   11/28/16 1836  TempSrc: Oral                 [name]

## 2016-11-29 NOTE — Progress Notes (Signed)
PT Cancellation Note  Patient Details Name: TYION GHOBRIAL MRN: [id number] DOB: [birth year]   Cancelled Treatment:    Reason Eval/Treat Not Completed: Patient not medically ready Pt still with sheath in place. Not appropriate for PT at this time. Will follow up.   Marguarite Arbour A Briony Parveen 11/29/2016, 9:16 AM Wray Kearns, PT, DPT [phone]

## 2016-11-30 ENCOUNTER — Inpatient Hospital Stay (HOSPITAL_COMMUNITY): Payer: Commercial Managed Care - HMO

## 2016-11-30 ENCOUNTER — Encounter (HOSPITAL_COMMUNITY): Payer: Self-pay | Admitting: Interventional Radiology

## 2016-11-30 DIAGNOSIS — I63312 Cerebral infarction due to thrombosis of left middle cerebral artery: Secondary | ICD-10-CM

## 2016-11-30 LAB — BASIC METABOLIC PANEL
ANION GAP: 11 (ref 5–15)
BUN: 13 mg/dL (ref 6–20)
CHLORIDE: 107 mmol/L (ref 101–111)
CO2: 20 mmol/L — ABNORMAL LOW (ref 22–32)
Calcium: 7.8 mg/dL — ABNORMAL LOW (ref 8.9–10.3)
Creatinine, Ser: 1.25 mg/dL — ABNORMAL HIGH (ref 0.61–1.24)
GFR, EST NON AFRICAN AMERICAN: 53 mL/min — AB (ref >60)
Glucose, Bld: 116 mg/dL — ABNORMAL HIGH (ref 65–99)
POTASSIUM: 3.7 mmol/L (ref 3.5–5.1)
SODIUM: 138 mmol/L (ref 135–145)

## 2016-11-30 LAB — GLUCOSE, CAPILLARY
GLUCOSE-CAPILLARY: 95 mg/dL (ref 65–99)
GLUCOSE-CAPILLARY: 111 mg/dL — AB (ref 65–99)
GLUCOSE-CAPILLARY: 124 mg/dL — AB (ref 65–99)
GLUCOSE-CAPILLARY: 109 mg/dL — AB (ref 65–99)
Glucose-Capillary: 101 mg/dL — ABNORMAL HIGH (ref 65–99)

## 2016-11-30 LAB — CBC
HEMATOCRIT: 40.4 % (ref 39.0–52.0)
HEMOGLOBIN: 13.1 g/dL (ref 13.0–17.0)
MCH: 29.3 pg (ref 26.0–34.0)
MCHC: 32.4 g/dL (ref 30.0–36.0)
MCV: 90.4 fL (ref 78.0–100.0)
Platelets: 343 10*3/uL (ref 150–400)
RBC: 4.47 MIL/uL (ref 4.22–5.81)
RDW: 15.7 % — AB (ref 11.5–15.5)
WBC: 13.7 10*3/uL — AB (ref 4.0–10.5)

## 2016-11-30 LAB — HEMOGLOBIN A1C
Hgb A1c MFr Bld: 5.9 % — ABNORMAL HIGH (ref 4.8–5.6)
MEAN PLASMA GLUCOSE: 123 mg/dL

## 2016-11-30 MED ORDER — SODIUM CHLORIDE 0.9 % IV SOLN
10.0000 mg/h | INTRAVENOUS | Status: DC
  Administered 2016-11-30: 10 mg/h via INTRAVENOUS
  Filled 2016-11-30: qty 10

## 2016-11-30 MED ORDER — MORPHINE BOLUS VIA INFUSION
5.0000 mg | INTRAVENOUS | Status: DC | PRN
  Filled 2016-11-30: qty 20

## 2016-11-30 MED ORDER — LABETALOL HCL 5 MG/ML IV SOLN
10.0000 mg | Freq: Once | INTRAVENOUS | Status: DC
  Filled 2016-11-30: qty 4

## 2016-11-30 MED ORDER — LABETALOL HCL 5 MG/ML IV SOLN: 10.0000 mg | INTRAVENOUS | Status: DC | PRN

## 2016-11-30 NOTE — Progress Notes (Signed)
Patient placed on high flow nasal canula at 8 liters per minute per RT. Dr. Cheral Marker called in regards to elevated blood pressure and heart rate. Order placed for labetalol now and prn.

## 2016-11-30 NOTE — Progress Notes (Signed)
Referring Physician(s): Dr Royal Hawthorn  Supervising Physician: Luanne Bras  Patient Status:  [facility] - In-pt  Chief Complaint:  CVA L MCA revascularization 12/11 pm  Subjective:  Vent No response Rt face droop more pronounced today  MRI 2023-12-12:  IMPRESSION: 1. Large left MCA distribution infarct involving the left insula and frontal, temporal and parietal opercula without hemorrhage or significant mass effect. 2. Punctate foci of diffusion restriction within the left caudate head, posterior left temporal lobe and left occipital lobe, likely of embolic etiology. 3. Findings of chronic microvascular ischemia.    Allergies: Ativan [lorazepam]; Other; and Valium [diazepam]  Medications: Prior to Admission medications   Medication Sig Start Date End Date Taking? Authorizing Provider  Biotin 5000 MCG CAPS Take 2,500 mcg by mouth daily.    Yes Historical Provider, MD  hydrochlorothiazide (MICROZIDE) 12.5 MG capsule Take 12.5 mg by mouth daily.   Yes Historical Provider, MD  MELATONIN PO Take 1 capsule by mouth at bedtime as needed (for sleep).   Yes Historical Provider, MD  Methylfol-Methylcob-Acetylcyst (METAFOLBIC PLUS) 6-2-600 MG TABS Take 1 tablet by mouth daily. Patient taking differently: Take 1 tablet by mouth daily. Pt. Is taking for food supplement for memory problem 02/16/16  Yes Garvin Fila, MD  Omega-3 Fatty Acids ([employer] FISH OIL) 1000 MG CAPS Take 1,000 mg by mouth every Wednesday.    Yes Historical Provider, MD  Probiotic Product (PROBIOTIC DAILY PO) Take 1 tablet by mouth daily.    Yes Historical Provider, MD  vitamin A 10000 UNIT capsule Take 10,000 Units by mouth daily.   Yes Historical Provider, MD  vitamin C (ASCORBIC ACID) 500 MG tablet Take 500 mg by mouth daily.   Yes Historical Provider, MD  Chlorpheniramine-DM 4-30 MG TABS Take 1 tablet by mouth daily as needed (for cold symptoms).    Historical Provider, MD  metoprolol tartrate (LOPRESSOR) 25 MG  tablet Take 1 tablet (25 mg total) by mouth 2 (two) times daily. Patient not taking: Reported on 2016/12/11 03/22/16   Wellington Hampshire, MD  oxyCODONE-acetaminophen (PERCOCET/ROXICET) 5-325 MG tablet Take 1-2 tablets by mouth every 4 (four) hours as needed for moderate pain. Patient not taking: Reported on 12-11-16 03/23/16   Ulyses Amor, PA-C     Vital Signs: BP (!) 155/74   Pulse 98   Temp (!) 100.4 F (38 C) (Axillary)   Resp 17   Ht 5\' 11"  (1.803 m)   Wt 160 lb 4.4 oz (72.7 kg)   SpO2 100%   BMI 22.35 kg/m   Physical Exam  HENT:  Rt facial droop pronounced today  Pulmonary/Chest:  vent  Musculoskeletal:  No response no movement  Skin: Skin is warm.  Left groin clean and dry NT No bleeding No hematoma L foot 2+ pulses  Nursing note and vitals reviewed.   Imaging: Ct Angio Head W Or Wo Contrast  Result Date: 11/28/2016 CLINICAL DATA:  Slurred speech EXAM: CT PERFUSION BRAIN CT angiography head and neck TECHNIQUE: Multiphase CT imaging of the brain was performed following IV bolus contrast injection. Subsequent parametric perfusion maps were calculated using RAPID software. CT angiography of the head and neck were also performed. Stenosis measurements, as reported, are calculated using the NASCET criteria. CONTRAST:  90 mL Isovue 370 IV her COMPARISON:  Head CT 11/28/2016 and 09/12/2011 FINDINGS: CTA NECK FINDINGS Aortic arch: There is atherosclerotic calcification of the aortic arch with mild-to-moderate narrowing at the arch vessel origins. Proximal subclavian arteries are patent.  There is a normal 3 vessel arch configuration. The visualized proximal subclavian arteries are normal. Right carotid system: There is partially calcified plaque at the right carotid bifurcation without hemodynamically significant stenosis. Left carotid system: There is predominantly noncalcified plaque at the proximal left common carotid artery and within the distal left common carotid artery,  resulting in approximately 25% narrowing of the arterial lumen. The left external carotid artery is occluded near its origin, with distal reconstitution via collateral pathways. There is predominantly noncalcified plaque along most of the course of the left internal carotid artery resulting in stenosis that measures up to approximately 50%. Vertebral arteries: The vertebral system is left dominant. There is moderate narrowing of the left vertebral artery origin secondary to atherosclerotic plaque. The right vertebral artery is occluded at its origin and along its entire cervical length. Course and caliber of the left vertebral artery are normal to the junction with the basilar artery. There is minimal opacification of the right V4 segment secondary to collateral flow. Skeleton: There is no bony spinal canal stenosis. No lytic or blastic lesions. Other neck: The nasopharynx is clear. The oropharynx and hypopharynx are normal. The epiglottis is normal. The supraglottic larynx, glottis and subglottic larynx are normal. No retropharyngeal collection. The parapharyngeal spaces are preserved. The parotid and submandibular glands are normal. No sialolithiasis or salivary ductal dilatation. The thyroid gland is normal. There is no cervical lymphadenopathy. Upper chest: Biapical centrilobular emphysema.  No pleural effusion. Review of the MIP images confirms the above findings CTA HEAD FINDINGS Anterior circulation: --Intracranial internal carotid arteries: There is atherosclerotic calcification of both intracranial internal carotid artery is without severe stenosis. --Anterior cerebral arteries: Normal. --Middle cerebral arteries: There is occlusion of the left MCA M2 superior division. The right MCA is normal. --Posterior communicating arteries: Present on the right. Posterior circulation: --Posterior cerebral arteries: Normal. --Superior cerebellar arteries: Normal. --Basilar artery: Normal. --Anterior inferior cerebellar  arteries: Normal. --Posterior inferior cerebellar arteries: Normal. Venous sinuses: As permitted by contrast timing, patent. Anatomic variants: Predominantly fetal origin of the right posterior cerebral artery. Delayed phase: Not performed Review of the MIP images confirms the above findings CT Brain Perfusion Findings: CBF (<30%) Volume: 31mL Perfusion (Tmax>6.0s) volume: 243mL Mismatch Volume: 240mL Infarction Location:Left MCA distribution IMPRESSION: 1. Perfusion abnormalities consistent with left MCA infarct with mismatch volume measuring 235 mL. 2. 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 3. 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 Posterior cerebral circulation remains patent. 4. Left carotid system atherosclerotic disease resulting and mild common carotid stenosis, proximal occlusion of the external carotid artery and approximately 50% narrowing of the left internal carotid artery. Critical Value/emergent results were called by telephone at the time of interpretation on 11/28/2016 at 6:35 pm to Dr. Roland Rack , who verbally acknowledged these results. Electronically Signed   By: Ulyses Jarred M.D.   On: 11/28/2016 18:59   Ct Head Wo Contrast  Result Date: 11/29/2016 CLINICAL DATA:  82 y/o  M; stroke for follow-up. EXAM: CT HEAD WITHOUT CONTRAST TECHNIQUE: Contiguous axial images were obtained from the base of the skull through the vertex without intravenous contrast. COMPARISON:  11/28/2016 CT of the head. FINDINGS: Brain: Cortical hypoattenuation involving the left insular and frontal operculum compatible with evolving infarction. Background of moderate to severe chronic microvascular ischemic changes and mild parenchymal volume loss similar to prior study. Vascular: Persistent contrast opacification of the blood pool. Moderate calcification of the cavernous internal carotid  arteries. Skull: Normal. Negative for  fracture or focal lesion. Sinuses/Orbits: Right frontal sinus mucous retention cyst. Partial opacification of left mastoid air cell, probably related to intubation. Bilateral intra-ocular lens replacement. Other: None. IMPRESSION: 1. Cortical hypoattenuation with mild local mass effect involving the left insula and frontal operculum compatible with infarction. No intracranial hemorrhage identified. 2. Stable background of moderate to severe chronic microvascular ischemic changes and mild brain parenchymal volume loss. These results will be called to the ordering clinician or representative by the Radiologist Assistant, and communication documented in the PACS or zVision Dashboard. Electronically Signed   By: Kristine Garbe M.D.   On: 11/29/2016 06:30   Ct Angio Neck W And/or Wo Contrast  Result Date: 11/28/2016 CLINICAL DATA:  Slurred speech EXAM: CT PERFUSION BRAIN CT angiography head and neck TECHNIQUE: Multiphase CT imaging of the brain was performed following IV bolus contrast injection. Subsequent parametric perfusion maps were calculated using RAPID software. CT angiography of the head and neck were also performed. Stenosis measurements, as reported, are calculated using the NASCET criteria. CONTRAST:  90 mL Isovue 370 IV her COMPARISON:  Head CT 11/28/2016 and 09/12/2011 FINDINGS: CTA NECK FINDINGS Aortic arch: There is atherosclerotic calcification of the aortic arch with mild-to-moderate narrowing at the arch vessel origins. Proximal subclavian arteries are patent. There is a normal 3 vessel arch configuration. The visualized proximal subclavian arteries are normal. Right carotid system: There is partially calcified plaque at the right carotid bifurcation without hemodynamically significant stenosis. Left carotid system: There is predominantly noncalcified plaque at the proximal left common carotid artery and within the distal left common carotid artery, resulting in approximately 25%  narrowing of the arterial lumen. The left external carotid artery is occluded near its origin, with distal reconstitution via collateral pathways. There is predominantly noncalcified plaque along most of the course of the left internal carotid artery resulting in stenosis that measures up to approximately 50%. Vertebral arteries: The vertebral system is left dominant. There is moderate narrowing of the left vertebral artery origin secondary to atherosclerotic plaque. The right vertebral artery is occluded at its origin and along its entire cervical length. Course and caliber of the left vertebral artery are normal to the junction with the basilar artery. There is minimal opacification of the right V4 segment secondary to collateral flow. Skeleton: There is no bony spinal canal stenosis. No lytic or blastic lesions. Other neck: The nasopharynx is clear. The oropharynx and hypopharynx are normal. The epiglottis is normal. The supraglottic larynx, glottis and subglottic larynx are normal. No retropharyngeal collection. The parapharyngeal spaces are preserved. The parotid and submandibular glands are normal. No sialolithiasis or salivary ductal dilatation. The thyroid gland is normal. There is no cervical lymphadenopathy. Upper chest: Biapical centrilobular emphysema.  No pleural effusion. Review of the MIP images confirms the above findings CTA HEAD FINDINGS Anterior circulation: --Intracranial internal carotid arteries: There is atherosclerotic calcification of both intracranial internal carotid artery is without severe stenosis. --Anterior cerebral arteries: Normal. --Middle cerebral arteries: There is occlusion of the left MCA M2 superior division. The right MCA is normal. --Posterior communicating arteries: Present on the right. Posterior circulation: --Posterior cerebral arteries: Normal. --Superior cerebellar arteries: Normal. --Basilar artery: Normal. --Anterior inferior cerebellar arteries: Normal. --Posterior  inferior cerebellar arteries: Normal. Venous sinuses: As permitted by contrast timing, patent. Anatomic variants: Predominantly fetal origin of the right posterior cerebral artery. Delayed phase: Not performed Review of the MIP images confirms the above findings CT Brain Perfusion Findings: CBF (<30%) Volume: 41mL  Perfusion (Tmax>6.0s) volume: 263mL Mismatch Volume: 222mL Infarction Location:Left MCA distribution IMPRESSION: 1. Perfusion abnormalities consistent with left MCA infarct with mismatch volume measuring 235 mL. 2. 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 3. 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 Posterior cerebral circulation remains patent. 4. Left carotid system atherosclerotic disease resulting and mild common carotid stenosis, proximal occlusion of the external carotid artery and approximately 50% narrowing of the left internal carotid artery. Critical Value/emergent results were called by telephone at the time of interpretation on 11/28/2016 at 6:35 pm to Dr. Roland Rack , who verbally acknowledged these results. Electronically Signed   By: Ulyses Jarred M.D.   On: 11/28/2016 18:59   Mr Brain Wo Contrast  Result Date: 11/29/2016 CLINICAL DATA:  Carotid endarterectomy. Acute onset AFib agent. Facial droop and dysarthria. Recent angiogram. EXAM: MRI HEAD WITHOUT CONTRAST TECHNIQUE: Multiplanar, multiecho pulse sequences of the brain and surrounding structures were obtained without intravenous contrast. COMPARISON:  Head CT 11/29/2016, cerebral angiogram 11/28/2016 FINDINGS: Brain: The midline structures are normal. There is a large region of diffusion restriction within the left MCA distribution, involving the frontal, parietal and temporal lobes, including the insular cortex. There are punctate foci of diffusion restriction within the posterior left temporal lobe and left occipital lobe and within the left  caudate head. There is extensive hyperintense T2 weighted signal in the above-described distribution. There is diffuse confluent hyperintense T2-weighted signal within the periventricular and deep white matter, most often seen in the setting of chronic microvascular ischemia. There is no midline shift or significant mass effect. No evidence of hemorrhage. There is superior right parietal encephalomalacia. No hydrocephalus or extra-axial fluid collection. No age advanced or lobar predominant atrophy. Vascular: Major intracranial central arterial and venous sinus flow voids are preserved. Punctate focus of chronic microhemorrhage in the left frontal lobe. Skull and upper cervical spine: The visualized skull base, calvarium, upper cervical spine and extracranial soft tissues are normal. Sinuses/Orbits: No fluid levels or advanced mucosal thickening. No mastoid effusion. Normal orbits. IMPRESSION: 1. Large left MCA distribution infarct involving the left insula and frontal, temporal and parietal opercula without hemorrhage or significant mass effect. 2. Punctate foci of diffusion restriction within the left caudate head, posterior left temporal lobe and left occipital lobe, likely of embolic etiology. 3. Findings of chronic microvascular ischemia. Electronically Signed   By: Ulyses Jarred M.D.   On: 11/29/2016 19:22   Ct Cerebral Perfusion W Contrast  Result Date: 11/28/2016 CLINICAL DATA:  Slurred speech EXAM: CT PERFUSION BRAIN CT angiography head and neck TECHNIQUE: Multiphase CT imaging of the brain was performed following IV bolus contrast injection. Subsequent parametric perfusion maps were calculated using RAPID software. CT angiography of the head and neck were also performed. Stenosis measurements, as reported, are calculated using the NASCET criteria. CONTRAST:  90 mL Isovue 370 IV her COMPARISON:  Head CT 11/28/2016 and 09/12/2011 FINDINGS: CTA NECK FINDINGS Aortic arch: There is atherosclerotic  calcification of the aortic arch with mild-to-moderate narrowing at the arch vessel origins. Proximal subclavian arteries are patent. There is a normal 3 vessel arch configuration. The visualized proximal subclavian arteries are normal. Right carotid system: There is partially calcified plaque at the right carotid bifurcation without hemodynamically significant stenosis. Left carotid system: There is predominantly noncalcified plaque at the proximal left common carotid artery and within the distal left common carotid artery, resulting in approximately 25% narrowing of the arterial lumen. The left external  carotid artery is occluded near its origin, with distal reconstitution via collateral pathways. There is predominantly noncalcified plaque along most of the course of the left internal carotid artery resulting in stenosis that measures up to approximately 50%. Vertebral arteries: The vertebral system is left dominant. There is moderate narrowing of the left vertebral artery origin secondary to atherosclerotic plaque. The right vertebral artery is occluded at its origin and along its entire cervical length. Course and caliber of the left vertebral artery are normal to the junction with the basilar artery. There is minimal opacification of the right V4 segment secondary to collateral flow. Skeleton: There is no bony spinal canal stenosis. No lytic or blastic lesions. Other neck: The nasopharynx is clear. The oropharynx and hypopharynx are normal. The epiglottis is normal. The supraglottic larynx, glottis and subglottic larynx are normal. No retropharyngeal collection. The parapharyngeal spaces are preserved. The parotid and submandibular glands are normal. No sialolithiasis or salivary ductal dilatation. The thyroid gland is normal. There is no cervical lymphadenopathy. Upper chest: Biapical centrilobular emphysema.  No pleural effusion. Review of the MIP images confirms the above findings CTA HEAD FINDINGS Anterior  circulation: --Intracranial internal carotid arteries: There is atherosclerotic calcification of both intracranial internal carotid artery is without severe stenosis. --Anterior cerebral arteries: Normal. --Middle cerebral arteries: There is occlusion of the left MCA M2 superior division. The right MCA is normal. --Posterior communicating arteries: Present on the right. Posterior circulation: --Posterior cerebral arteries: Normal. --Superior cerebellar arteries: Normal. --Basilar artery: Normal. --Anterior inferior cerebellar arteries: Normal. --Posterior inferior cerebellar arteries: Normal. Venous sinuses: As permitted by contrast timing, patent. Anatomic variants: Predominantly fetal origin of the right posterior cerebral artery. Delayed phase: Not performed Review of the MIP images confirms the above findings CT Brain Perfusion Findings: CBF (<30%) Volume: 61mL Perfusion (Tmax>6.0s) volume: 283mL Mismatch Volume: 257mL Infarction Location:Left MCA distribution IMPRESSION: 1. Perfusion abnormalities consistent with left MCA infarct with mismatch volume measuring 235 mL. 2. 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 3. 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 Posterior cerebral circulation remains patent. 4. Left carotid system atherosclerotic disease resulting and mild common carotid stenosis, proximal occlusion of the external carotid artery and approximately 50% narrowing of the left internal carotid artery. Critical Value/emergent results were called by telephone at the time of interpretation on 11/28/2016 at 6:35 pm to Dr. Roland Rack , who verbally acknowledged these results. Electronically Signed   By: Ulyses Jarred M.D.   On: 11/28/2016 18:59   Dg Chest Port 1 View  Result Date: 11/28/2016 CLINICAL DATA:  82 y/o  M; post intubation. EXAM: PORTABLE CHEST 1 VIEW COMPARISON:  08/22/2011 chest radiograph FINDINGS: Clear  lungs. No pleural effusion. Stable cardiac silhouette given projection and technique. Bones are unremarkable. Endotracheal tube is approximately or in 3.6 cm from the carina. IMPRESSION: No acute cardiopulmonary process. Endotracheal tube is approximately 3.6 cm from the carina. Electronically Signed   By: Kristine Garbe M.D.   On: 11/28/2016 23:23   Dg Abd Portable 1v  Result Date: 11/29/2016 CLINICAL DATA:  82 year old male status post enteric tube placement. Initial encounter. EXAM: PORTABLE ABDOMEN - 1 VIEW COMPARISON:  Portable chest 11/28/2016 FINDINGS: Portable AP supine view at 0637 hours. Enteric tube courses to the left upper quadrant, side hole just below the level of the medial diaphragm. Visible mediastinum and lung parenchyma appears stable since yesterday. Gas and stool filled large bowel, no dilated small bowel in the  visible abdomen. Left hand artifact over the left lower quadrant. No acute osseous abnormality identified. No definite pneumoperitoneum on this supine view. IMPRESSION: 1. Enteric tube side hole the level of the gastroesophageal junction. Advance 5-6 cm for more optimal placement. 2. Gas and stool filled large bowel without strong evidence of mechanical bowel obstruction at this time. Electronically Signed   By: Genevie Ann M.D.   On: 11/29/2016 08:35   Ct Head Code Stroke Wo Contrast`  Addendum Date: 11/28/2016   ADDENDUM REPORT: 11/28/2016 23:11 ADDENDUM: Though findings could represent contrast staining, this is an atypical appearance for said entity. Acute findings discussed with and reconfirmed by Dr.LINDZEN on 11/28/2016 at 11:00 pm. Electronically Signed   By: Elon Alas M.D.   On: 11/28/2016 23:11   Result Date: 11/28/2016 CLINICAL DATA:  Code stroke. Status post revascularization and clot retrieval LEFT MCA. 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 EXAM: CT HEAD WITHOUT CONTRAST TECHNIQUE: Contiguous axial images were obtained  from the base of the skull through the vertex without intravenous contrast. COMPARISON:  None. FINDINGS: BRAIN: Intravascular contrast limits evaluation of intracranial hemorrhage. No lobar hematoma, midline shift or mass effect. Faint enhancement LEFT temporal lobe. Patchy to confluent supratentorial white matter hypodensities. Old bilateral basal ganglia lacunar infarcts. No abnormal extra-axial fluid collections. Basal cisterns are patent. VASCULAR: Moderate calcific atherosclerosis of the carotid siphons. Contrast opacifies LEFT middle cerebral artery. SKULL: No skull fracture. LEFT sphenoid intraosseous lipoma. Probable bone island RIGHT superolateral orbital wall. No significant scalp soft tissue swelling. SINUSES/ORBITS: Trace LEFT mastoid effusion. Mild paranasal sinus mucosal thickening without air-fluid levels. The included ocular globes and orbital contents are non-suspicious. OTHER: Patient is edentulous.  Life-support lines in place. ASPECTS [name] Stroke Program Early CT Score) - Ganglionic level infarction (caudate, lentiform nuclei, internal capsule, insula, M1-M3 cortex): 7 - Supraganglionic infarction (M4-M6 cortex): 3 Total score (0-10 with 10 being normal): 10 IMPRESSION: 1. Status post endovascular revascularization LEFT MCA, faint enhancing LEFT temporal lobe consistent with collateral vessels in the setting of acute stroke, or, can be seen in subacute infarcts. Moderate to severe chronic small vessel ischemic disease, old bilateral basal ganglia lacunar infarcts. 2. ASPECTS is 10 though, limited by presence of intravascular contrast. Referring clinician being paged by the Radiology assistant, pending return call. Electronically Signed: By: Elon Alas M.D. On: 11/28/2016 22:47   Ct Head Code Stroke Wo Contrast`  Result Date: 11/28/2016 CLINICAL DATA:  Code stroke.  Slurred speech EXAM: CT HEAD WITHOUT CONTRAST TECHNIQUE: Contiguous axial images were obtained from the base of the  skull through the vertex without intravenous contrast. COMPARISON:  Head CT 09/12/2011 FINDINGS: Brain: No mass lesion, intraparenchymal hemorrhage or extra-axial collection. No evidence of acute cortical infarct. There is loss encephalomalacia in the right parietal and occipital lobes, compatible with prior infarcts. There is an old right lenticular capsular lacunar infarct. There is periventricular hypoattenuation compatible with chronic microvascular disease. Vascular: Mild calcification of the internal carotid arteries at the skullbase. Skull: Normal visualized skull base, calvarium and extracranial soft tissues. Sinuses/Orbits: Partial opacification of the left mastoid air cells. Normal orbits. ASPECTS [facility] Stroke Program Early CT Score) - Ganglionic level infarction (caudate, lentiform nuclei, internal capsule, insula, M1-M3 cortex): 7 - Supraganglionic infarction (M4-M6 cortex): 3 Total score (0-10 with 10 being normal): 10 IMPRESSION: 1. No acute intracranial hemorrhage. Findings of multiple old infarcts and chronic microvascular ischemia. 2. ASPECTS is 10. These results were called by telephone at the time of  interpretation on 11/28/2016 at 6:21 pm to Dr. Kathrynn Speed , who verbally acknowledged these results. Electronically Signed   By: Ulyses Jarred M.D.   On: 11/28/2016 18:22    Labs:  CBC:  Recent Labs  03/24/16 0440 11/28/16 1800 11/28/16 1810 11/29/16 0505 11/30/16 0515  WBC 13.8* 12.0*  --  16.4* 13.7*  HGB 12.9* 16.8 17.3* 14.3 13.1  HCT 40.8 50.7 51.0 44.0 40.4  PLT 418* 400  --  368 343    COAGS:  Recent Labs  03/16/16 1554 11/28/16 1800  INR 1.15 1.04  APTT 39* 39*    BMP:  Recent Labs  03/24/16 0440 11/28/16 1800 11/28/16 1810 11/29/16 0505 11/30/16 0515  NA 138 137 136 136 138  K 4.1 4.5 4.2 3.8 3.7  CL 103 100* 101 103 107  CO2 24 25  --  25 20*  GLUCOSE 113* 98 102* 120* 116*  BUN 21* 20 25* 16 13  CALCIUM 8.4* 9.4  --  8.2* 7.8*    CREATININE 1.17 1.49* 1.40* 1.34* 1.25*  GFRNONAA 57* 43*  --  48* 53*  GFRAA >60 49*  --  56* >60    LIVER FUNCTION TESTS:  Recent Labs  03/16/16 1554 11/28/16 1800  BILITOT 0.7 0.5  AST 26 26  ALT 15* 14*  ALKPHOS 77 81  PROT 7.3 7.2  ALBUMIN 3.8 4.1    Assessment and Plan:  CVA L MCA revascularization 12/11 pm Vent; no response  Electronically Signed: Parneet Glantz A 11/30/2016, 7:36 AM   I spent a total of 15 Minutes at the the patient's bedside AND on the patient's hospital floor or unit, greater than 50% of which was counseling/coordinating care for L MCA revasc

## 2016-11-30 NOTE — Evaluation (Signed)
Occupational Therapy Evaluation Patient Details Name: [name] MRN: [id number] DOB: 11/22/1934 Today's Date: 11/30/2016    History of Present Illness 82 yo male s/p  Large left MCA distribution infarct involving the left insula and frontal, temporal and parietal opercula without hemorrhage or significant mass effect. Punctate foci of diffusion restriction within the left caudate head, posterior left temporal lobe and left occipital lobe, likelyof embolic etiology. Findings of chronic microvascular ischemia.   Clinical Impression   PT admitted with L MCA. Pt currently with functional limitiations due to the deficits listed below (see OT problem list). PTA was independent with all adl and ambulation. Pt has (A) with iadls due to living with son.  Pt will benefit from skilled OT to increase their independence and safety with adls and balance to allow discharge CIR. Pt demonstrates sensory and motor deficits on the R.      Follow Up Recommendations  CIR    Equipment Recommendations  Wheelchair (measurements OT);Wheelchair cushion (measurements OT);Hospital bed;3 in 1 bedside commode    Recommendations for Other Services Rehab consult     Precautions / Restrictions Precautions Precautions: Fall Precaution Comments: risk for subluxation R UE       Mobility Bed Mobility Overal bed mobility: Needs Assistance Bed Mobility: Supine to Sit;Sit to Supine;Rolling Rolling: Total assist;+2 for physical assistance   Supine to sit: +2 for physical assistance;Total assist Sit to supine: +2 for physical assistance;Total assist   General bed mobility comments: pt pushing with L UE   Transfers                 General transfer comment: na at this time    Balance                                            ADL Overall ADL's : Needs assistance/impaired Eating/Feeding: NPO   Grooming: Total assistance   Upper Body Bathing: Total assistance   Lower  Body Bathing: Total assistance   Upper Body Dressing : Total assistance   Lower Body Dressing: Total assistance     Toilet Transfer Details (indicate cue type and reason): foley currently           General ADL Comments: pt bed level this session on 5L and increased to 6L with oxygen 88% with static sitting.      Vision Vision Assessment?: Yes Alignment/Gaze Preference: Gaze left Additional Comments: pt with L gaze preference and only able to come to midline one during session. daughter reports L gaze   Perception     Praxis      Pertinent Vitals/Pain Pain Assessment: No/denies pain     Hand Dominance     Extremity/Trunk Assessment Upper Extremity Assessment Upper Extremity Assessment: RUE deficits/detail RUE Deficits / Details: flaccid to response to painful stimuli   Lower Extremity Assessment Lower Extremity Assessment: RLE deficits/detail RLE Deficits / Details: flaccid   Cervical / Trunk Assessment Cervical / Trunk Assessment: Kyphotic   Communication Communication Communication: Expressive difficulties;Receptive difficulties   Cognition Arousal/Alertness: Awake/alert Behavior During Therapy: Restless Overall Cognitive Status: Difficult to assess                 General Comments: Pt demonstrates global aphasia. Pt demonstrates pushing from R to L with static sitting and decr awareness to position in space   General Comments  Exercises       Shoulder Instructions      Home Living Family/patient expects to be discharged to:: Private residence Living Arrangements: Children (staying at sons house) Available Help at Discharge: Family Type of Home: House Home Access: Stairs to enter     Home Layout: One level                   Additional Comments: Pt did not drive and completed all adls mod I.       Prior Functioning/Environment Level of Independence: Independent                 OT Problem List: Decreased  strength;Decreased activity tolerance;Impaired balance (sitting and/or standing);Decreased safety awareness;Decreased knowledge of use of DME or AE;Decreased knowledge of precautions;Cardiopulmonary status limiting activity;Impaired UE functional use;Impaired sensation;Decreased cognition;Decreased coordination;Impaired vision/perception;Decreased range of motion   OT Treatment/Interventions: Self-care/ADL training;Neuromuscular education;Therapeutic exercise;DME and/or AE instruction;Therapeutic activities;Visual/perceptual remediation/compensation;Patient/family education;Balance training;Cognitive remediation/compensation    OT Goals(Current goals can be found in the care plan section) Acute Rehab OT Goals Patient Stated Goal: none stated by patient OT Goal Formulation: Patient unable to participate in goal setting Time For Goal Achievement: 12/14/16 Potential to Achieve Goals: Good  OT Frequency: Min 3X/week   Barriers to D/C:            Co-evaluation PT/OT/SLP Co-Evaluation/Treatment: Yes Reason for Co-Treatment: Complexity of the patient's impairments (multi-system involvement);Necessary to address cognition/behavior during functional activity;For patient/therapist safety;To address functional/ADL transfers   OT goals addressed during session: ADL's and self-care;Strengthening/ROM      End of Session Equipment Utilized During Treatment: Oxygen Nurse Communication: Mobility status;Precautions;Need for lift equipment  Activity Tolerance: Patient limited by fatigue Patient left: in bed;with call bell/phone within reach;with bed alarm set;with family/visitor present;with restraints reapplied   Time: [id number] OT Time Calculation (min): 23 min Charges:  OT General Charges $OT Visit: 1 Procedure OT Evaluation $OT Eval High Complexity: 1 Procedure G-Codes:    Parke Poisson B 21-Dec-2016, 3:17 PM  Jeri Modena   OTR/L Pager: [phone] Office: [phone] .

## 2016-11-30 NOTE — Procedures (Signed)
Extubation Procedure Note  Patient Details:   Name: [name] DOB: 1934-07-30 MRN: [id number]   Airway Documentation:     Evaluation  O2 sats: stable throughout Complications: No apparent complications Patient did tolerate procedure well. Bilateral Breath Sounds: Other (Comment) (snoring sounds)   No   Positive cuff leak noted.  Pt making snoring sounds, but clear on inspiration.  Pt does not seem to understand the incentive spirometer instructions.  He pushes the mouthpiece away when we attempted to use it.  Rn aware.  [name] 11/30/2016, 10:09 AM

## 2016-11-30 NOTE — Progress Notes (Signed)
Daughter now stating that they would like to proceed with comfort measures and morphine drip.

## 2016-11-30 NOTE — Progress Notes (Signed)
STROKE TEAM PROGRESS NOTE   SUBJECTIVE (INTERVAL HISTORY) .Patient remains neurologically unchanged. He is aphasic with dense right hemiplegia purposeful left sided movements. MRI scan of the brain was done last night and shows a fairly large left MCA infarct  OBJECTIVE Temp:  [98.9 F (37.2 C)-100.6 F (38.1 C)] 99.8 F (37.7 C) (12/13 0800) Pulse Rate:  [86-125] 86 (12/13 0800) Cardiac Rhythm: Sinus tachycardia (12/13 0800) Resp:  [12-25] 14 (12/13 0800) BP: (123-176)/(56-88) 145/88 (12/13 0800) SpO2:  [90 %-100 %] 97 % (12/13 0800) Arterial Line BP: (102-172)/(51-72) 132/72 (12/13 0400) FiO2 (%):  [40 %] 40 % (12/13 0400)  CBC:  Recent Labs Lab 11/28/16 1800  11/29/16 0505 11/30/16 0515  WBC 12.0*  --  16.4* 13.7*  NEUTROABS 9.0*  --  14.3*  --   HGB 16.8  < > 14.3 13.1  HCT 50.7  < > 44.0 40.4  MCV 91.7  --  90.2 90.4  PLT 400  --  368 343  < > = values in this interval not displayed.  Basic Metabolic Panel:   Recent Labs Lab 11/29/16 0505 11/30/16 0515  NA 136 138  K 3.8 3.7  CL 103 107  CO2 25 20*  GLUCOSE 120* 116*  BUN 16 13  CREATININE 1.34* 1.25*  CALCIUM 8.2* 7.8*    Lipid Panel:     Component Value Date/Time   CHOL 156 11/29/2016 0505   TRIG 154 (H) 11/29/2016 0505   HDL 37 (L) 11/29/2016 0505   CHOLHDL 4.2 11/29/2016 0505   VLDL 31 11/29/2016 0505   LDLCALC 88 11/29/2016 0505   HgbA1c:  Lab Results  Component Value Date   HGBA1C 5.9 (H) 11/29/2016   Urine Drug Screen:     Component Value Date/Time   LABOPIA NONE DETECTED 11/29/2016 0843   COCAINSCRNUR NONE DETECTED 11/29/2016 0843   LABBENZ NONE DETECTED 11/29/2016 0843   AMPHETMU NONE DETECTED 11/29/2016 0843   THCU NONE DETECTED 11/29/2016 0843   LABBARB NONE DETECTED 11/29/2016 0843      IMAGING  Ct Head Code Stroke Wo Contrast` Addendum - Though findings could represent contrast staining, this is an atypical appearance for said entity.  11/28/2016 -  1. Status post  endovascular revascularization LEFT MCA, faint enhancing LEFT temporal lobe consistent with collateral vessels in the setting of acute stroke, or, can be seen in subacute infarcts. Moderate to severe chronic small vessel ischemic disease, old bilateral basal ganglia lacunar infarcts. 2. ASPECTS is 10 though, limited by presence of intravascular contrast.   Ct Head Code Stroke Wo Contrast` 11/28/2016 1. No acute intracranial hemorrhage. Findings of multiple old infarcts and chronic microvascular ischemia. 2. ASPECTS is 10.   Ct Angio Head W Or Wo Contrast Ct Angio Neck W And/or Wo Contrast Ct Cerebral Perfusion W Contrast 11/28/2016 1. Perfusion abnormalities consistent with left MCA infarct with mismatch volume measuring 235 mL. 2. 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 3. 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 Posterior cerebral circulation remains patent. 4. Left carotid system atherosclerotic disease resulting and mild common carotid stenosis, proximal occlusion of the external carotid artery and approximately 50% narrowing of the left internal carotid artery.   Ct Head Wo Contrast 11/29/2016 1. Cortical hypoattenuation with mild local mass effect involving the left insula and frontal operculum compatible with infarction. No intracranial hemorrhage identified. 2. Stable background of moderate to severe chronic microvascular ischemic changes and mild brain parenchymal volume  loss.   Dg Chest Port 1 View 11/29/2016 1. Stable endotracheal tube position. No acute cardiopulmonary abnormality. 2. NG tube side hole remains at the level of the gastroesophageal junction, advance 5-6 cm for more optimal placement. 11/28/2016 No acute cardiopulmonary process. Endotracheal tube is approximately 3.6 cm from the carina.   Dg Abd Portable 1v 11/29/2016 1. Enteric tube side hole the level of the gastroesophageal junction.  Advance 5-6 cm for more optimal placement. 2. Gas and stool filled large bowel without strong evidence of mechanical bowel obstruction at this time.   MRI 11/29/2016 1. Large left MCA distribution infarct involving the left insula and frontal, temporal and parietal opercula without hemorrhage or significant mass effect. 2. Punctate foci of diffusion restriction within the left caudate head, posterior left temporal lobe and left occipital lobe, likelyof embolic etiology. 3. Findings of chronic microvascular ischemia.   PHYSICAL EXAM Elderly Caucasian male who was intubated and sedated on propofol. . Afebrile. Head is nontraumatic. Neck is supple without bruit.    Cardiac exam no murmur or gallop. Lungs are clear to auscultation. Distal pulses are well felt. Neurological Exam : Patient is intubated and not sedated. Patient is globally aphasic and does not follow any commands. Eyes are closed. Pupils 4 mm irregular but reactive. Left gaze preference. Blinks to threat on the left but not the right. Right lower facial weakness. Dense right hemiplegia. Will not withdraw to painful stimuli in the right arm but with so slightly in the right leg. Purposeful antigravity movements on the left. Right plantar upgoing left downgoing.   ASSESSMENT/PLAN [name] is a 82 y.o. male with history of L CEA and mild memory loss presenting with aphasia, facial droop and dysarthria. He did not receive IV t-PA due to delay in arrival. He was sent to IR where he received TICI 2b revascularization of L MCA with solitaire and IA integrelin    Stroke:  Left MCA infarct s/p TICI 2b revascularization of L MCA with solitaire and IA integrelin. infarct embolic secondary to unknown source   Resultant  right hemiparesis and aphasia  CTA head and neck/perfusion - left MCA mismatch. Complete L M2 occlusion. Complete R VA occlusion. L ICA atherosclerosis with proximal occlusion of the ECA and L ICA 50% narrowing    Cerebral angio TICI 2b revascularization of L MCA with solitaire and IA integrelin. mod severe diffuse intracranial atherosclerosis, L VA 85-90% stenosis, occluded L ECA, and L VBJ 60% stenosis.   Post intervention CT no hemorrhage. Small vessel disease. Left insula and frontal infarct MRI  1. Large left MCA distribution infarct involving the left insula and frontal, temporal and parietal opercula without hemorrhage or significant mass effect. 2. Punctate foci of diffusion restriction within the left caudate head, posterior left temporal lobe and left occipital lobe, likely  of embolic etiology 2D Echo  Left ventricle: The cavity size was normal. Wall thickness was   increased in a pattern of mild LVH. Systolic function was   vigorous. The estimated ejection fraction was in the range of 65%   to 70%. Wall motion was normal; there were no regional wall    motion abnormalities  LDL 88  HgbA1c 5.9  SCDs for VTE prophylaxis Diet NPO time specified  No antithrombotic listed on med rec prior to admission, now on  Aspirin 300 mg suppository  Ongoing aggressive stroke risk factor management  Therapy recommendations: pending  Disposition:  pending  Hyperlipidemia  Home meds:  No statin  LDL 88, goal < 70  Add statin once able to swallow  Continue statin at discharge  Other Stroke Risk Factors  Advanced age  Former Cigarette smoker  PAD  History carotid stenosis status post left CEA 03/2016  Other Active Problems  Baseline mild memory deficit, followed by Dr. Leonie Man as outpatient   History of depression, on Celexa in the past but nothing currently  Hospital day # 2  I have personally examined this patient, reviewed notes, independently viewed imaging studies, participated in medical decision making and plan of care.ROS completed by me personally and pertinent positives fully documented  I have made any additions or clarifications directly to the above note. Agree  with note above. I had a long discussion with the patient's daughter at the bedside and discuss MRI images and neurological prognosis and answered questions. Plan to extubate today tolerated. Patient is DO NOT RESUSCITATE as per his wishes and family is in agreement. Discussed with Dr. Halford Chessman. Swallow eval after extubation This patient is critically ill and at significant risk of neurological worsening, death and care requires constant monitoring of vital signs, hemodynamics,respiratory and cardiac monitoring, extensive review of multiple databases, frequent neurological assessment, discussion with family, other specialists and medical decision making of high complexity.I have made any additions or clarifications directly to the above note.This critical care time does not reflect procedure time, or teaching time or supervisory time of PA/NP/Med Resident etc but could involve care discussion time.  I spent 30 minutes of neurocritical care time  in the care of  this patient.      Antony Contras, MD Medical Director [name] Stroke Center Pager: [phone] 11/30/2016 1:59 PM   To contact Stroke Continuity provider, please refer to [email or web address] After hours, contact General Neurology

## 2016-11-30 NOTE — Progress Notes (Signed)
PCCM Progress Note  Admission date: 11/28/2016 Consult date: 11/28/2016 Referring provider: Dr. Leonel Ramsay  CC: Aphasia  HPI: 82 yo male presented to ER with aphasia, facial droop, dysarthria.  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  He had neuro IR intervention, and remained on vent post procedure.  PMHx of Depression, GERD, PAD  Subjective: Tolerating SBT  Vital signs:  BP (!) 145/88   Pulse 86   Temp 99.8 F (37.7 C) (Axillary) Comment: tylenol given  Resp 14   Ht 5\' 11"  (1.803 m)   Wt 160 lb 4.4 oz (72.7 kg)   SpO2 97%   BMI 22.35 kg/m   Intake/output: I/O last 3 completed shifts: In: 6764.1 [I.V.:6714.1; IV Piggyback:50] Out: [id number] [Urine:3505; Blood:20]  General: tolerating SBT Neuro: RASS +1 HEENT: ETT in place Cardiac: regular Chest: no wheeze Abd: soft, non tender Ext: no edema Skin: no rashes   CMP Latest Ref Rng & Units 11/30/2016 11/29/2016 11/28/2016  Glucose 65 - 99 mg/dL 116(H) 120(H) 102(H)  BUN 6 - 20 mg/dL 13 16 25(H)  Creatinine 0.61 - 1.24 mg/dL 1.25(H) 1.34(H) 1.40(H)  Sodium 135 - 145 mmol/L 138 136 136  Potassium 3.5 - 5.1 mmol/L 3.7 3.8 4.2  Chloride 101 - 111 mmol/L 107 103 101  CO2 22 - 32 mmol/L 20(L) 25 -  Calcium 8.9 - 10.3 mg/dL 7.8(L) 8.2(L) -  Total Protein 6.5 - 8.1 g/dL - - -  Total Bilirubin 0.3 - 1.2 mg/dL - - -  Alkaline Phos 38 - 126 U/L - - -  AST 15 - 41 U/L - - -  ALT 17 - 63 U/L - - -    CBC Latest Ref Rng & Units 11/30/2016 11/29/2016 11/28/2016  WBC 4.0 - 10.5 K/uL 13.7(H) 16.4(H) -  Hemoglobin 13.0 - 17.0 g/dL 13.1 14.3 17.3(H)  Hematocrit 39.0 - 52.0 % 40.4 44.0 51.0  Platelets 150 - 400 K/uL 343 368 -    ABG    Component Value Date/Time   PHART 7.464 (H) 11/29/2016 0021   PCO2ART 35.3 11/29/2016 0021   PO2ART 393 (H) 11/29/2016 0021   HCO3 25.0 11/29/2016 0021   TCO2 24 11/28/2016 1810   O2SAT 99.6 11/29/2016 0021    CBG (last 3)   Recent Labs  11/29/16 2316 11/30/16 0410 11/30/16 0801  GLUCAP 123*  101* 95    Imaging: Ct Angio Head W Or Wo Contrast  Result Date: 11/28/2016 CLINICAL DATA:  Slurred speech EXAM: CT PERFUSION BRAIN CT angiography head and neck TECHNIQUE: Multiphase CT imaging of the brain was performed following IV bolus contrast injection. Subsequent parametric perfusion maps were calculated using RAPID software. CT angiography of the head and neck were also performed. Stenosis measurements, as reported, are calculated using the NASCET criteria. CONTRAST:  90 mL Isovue 370 IV her COMPARISON:  Head CT 11/28/2016 and 09/12/2011 FINDINGS: CTA NECK FINDINGS Aortic arch: There is atherosclerotic calcification of the aortic arch with mild-to-moderate narrowing at the arch vessel origins. Proximal subclavian arteries are patent. There is a normal 3 vessel arch configuration. The visualized proximal subclavian arteries are normal. Right carotid system: There is partially calcified plaque at the right carotid bifurcation without hemodynamically significant stenosis. Left carotid system: There is predominantly noncalcified plaque at the proximal left common carotid artery and within the distal left common carotid artery, resulting in approximately 25% narrowing of the arterial lumen. The left external carotid artery is occluded near its origin, with distal reconstitution via collateral pathways. There is predominantly noncalcified plaque  along most of the course of the left internal carotid artery resulting in stenosis that measures up to approximately 50%. Vertebral arteries: The vertebral system is left dominant. There is moderate narrowing of the left vertebral artery origin secondary to atherosclerotic plaque. The right vertebral artery is occluded at its origin and along its entire cervical length. Course and caliber of the left vertebral artery are normal to the junction with the basilar artery. There is minimal opacification of the right V4 segment secondary to collateral flow. Skeleton:  There is no bony spinal canal stenosis. No lytic or blastic lesions. Other neck: The nasopharynx is clear. The oropharynx and hypopharynx are normal. The epiglottis is normal. The supraglottic larynx, glottis and subglottic larynx are normal. No retropharyngeal collection. The parapharyngeal spaces are preserved. The parotid and submandibular glands are normal. No sialolithiasis or salivary ductal dilatation. The thyroid gland is normal. There is no cervical lymphadenopathy. Upper chest: Biapical centrilobular emphysema.  No pleural effusion. Review of the MIP images confirms the above findings CTA HEAD FINDINGS Anterior circulation: --Intracranial internal carotid arteries: There is atherosclerotic calcification of both intracranial internal carotid artery is without severe stenosis. --Anterior cerebral arteries: Normal. --Middle cerebral arteries: There is occlusion of the left MCA M2 superior division. The right MCA is normal. --Posterior communicating arteries: Present on the right. Posterior circulation: --Posterior cerebral arteries: Normal. --Superior cerebellar arteries: Normal. --Basilar artery: Normal. --Anterior inferior cerebellar arteries: Normal. --Posterior inferior cerebellar arteries: Normal. Venous sinuses: As permitted by contrast timing, patent. Anatomic variants: Predominantly fetal origin of the right posterior cerebral artery. Delayed phase: Not performed Review of the MIP images confirms the above findings CT Brain Perfusion Findings: CBF (<30%) Volume: 85mL Perfusion (Tmax>6.0s) volume: 226mL Mismatch Volume: 27mL Infarction Location:Left MCA distribution IMPRESSION: 1. Perfusion abnormalities consistent with left MCA infarct with mismatch volume measuring 235 mL. 2. 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 3. 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 Posterior cerebral circulation remains patent. 4. Left carotid  system atherosclerotic disease resulting and mild common carotid stenosis, proximal occlusion of the external carotid artery and approximately 50% narrowing of the left internal carotid artery. Critical Value/emergent results were called by telephone at the time of interpretation on 11/28/2016 at 6:35 pm to Dr. Roland Rack , who verbally acknowledged these results. Electronically Signed   By: Ulyses Jarred M.D.   On: 11/28/2016 18:59   Ct Head Wo Contrast  Result Date: 11/29/2016 CLINICAL DATA:  82 y/o  M; stroke for follow-up. EXAM: CT HEAD WITHOUT CONTRAST TECHNIQUE: Contiguous axial images were obtained from the base of the skull through the vertex without intravenous contrast. COMPARISON:  11/28/2016 CT of the head. FINDINGS: Brain: Cortical hypoattenuation involving the left insular and frontal operculum compatible with evolving infarction. Background of moderate to severe chronic microvascular ischemic changes and mild parenchymal volume loss similar to prior study. Vascular: Persistent contrast opacification of the blood pool. Moderate calcification of the cavernous internal carotid arteries. Skull: Normal. Negative for fracture or focal lesion. Sinuses/Orbits: Right frontal sinus mucous retention cyst. Partial opacification of left mastoid air cell, probably related to intubation. Bilateral intra-ocular lens replacement. Other: None. IMPRESSION: 1. Cortical hypoattenuation with mild local mass effect involving the left insula and frontal operculum compatible with infarction. No intracranial hemorrhage identified. 2. Stable background of moderate to severe chronic microvascular ischemic changes and mild brain parenchymal volume loss. These results will be called to the ordering clinician or representative by  the Radiologist Assistant, and communication documented in the PACS or zVision Dashboard. Electronically Signed   By: Kristine Garbe M.D.   On: 11/29/2016 06:30   Ct Angio Neck W  And/or Wo Contrast  Result Date: 11/28/2016 CLINICAL DATA:  Slurred speech EXAM: CT PERFUSION BRAIN CT angiography head and neck TECHNIQUE: Multiphase CT imaging of the brain was performed following IV bolus contrast injection. Subsequent parametric perfusion maps were calculated using RAPID software. CT angiography of the head and neck were also performed. Stenosis measurements, as reported, are calculated using the NASCET criteria. CONTRAST:  90 mL Isovue 370 IV her COMPARISON:  Head CT 11/28/2016 and 09/12/2011 FINDINGS: CTA NECK FINDINGS Aortic arch: There is atherosclerotic calcification of the aortic arch with mild-to-moderate narrowing at the arch vessel origins. Proximal subclavian arteries are patent. There is a normal 3 vessel arch configuration. The visualized proximal subclavian arteries are normal. Right carotid system: There is partially calcified plaque at the right carotid bifurcation without hemodynamically significant stenosis. Left carotid system: There is predominantly noncalcified plaque at the proximal left common carotid artery and within the distal left common carotid artery, resulting in approximately 25% narrowing of the arterial lumen. The left external carotid artery is occluded near its origin, with distal reconstitution via collateral pathways. There is predominantly noncalcified plaque along most of the course of the left internal carotid artery resulting in stenosis that measures up to approximately 50%. Vertebral arteries: The vertebral system is left dominant. There is moderate narrowing of the left vertebral artery origin secondary to atherosclerotic plaque. The right vertebral artery is occluded at its origin and along its entire cervical length. Course and caliber of the left vertebral artery are normal to the junction with the basilar artery. There is minimal opacification of the right V4 segment secondary to collateral flow. Skeleton: There is no bony spinal canal stenosis.  No lytic or blastic lesions. Other neck: The nasopharynx is clear. The oropharynx and hypopharynx are normal. The epiglottis is normal. The supraglottic larynx, glottis and subglottic larynx are normal. No retropharyngeal collection. The parapharyngeal spaces are preserved. The parotid and submandibular glands are normal. No sialolithiasis or salivary ductal dilatation. The thyroid gland is normal. There is no cervical lymphadenopathy. Upper chest: Biapical centrilobular emphysema.  No pleural effusion. Review of the MIP images confirms the above findings CTA HEAD FINDINGS Anterior circulation: --Intracranial internal carotid arteries: There is atherosclerotic calcification of both intracranial internal carotid artery is without severe stenosis. --Anterior cerebral arteries: Normal. --Middle cerebral arteries: There is occlusion of the left MCA M2 superior division. The right MCA is normal. --Posterior communicating arteries: Present on the right. Posterior circulation: --Posterior cerebral arteries: Normal. --Superior cerebellar arteries: Normal. --Basilar artery: Normal. --Anterior inferior cerebellar arteries: Normal. --Posterior inferior cerebellar arteries: Normal. Venous sinuses: As permitted by contrast timing, patent. Anatomic variants: Predominantly fetal origin of the right posterior cerebral artery. Delayed phase: Not performed Review of the MIP images confirms the above findings CT Brain Perfusion Findings: CBF (<30%) Volume: 66mL Perfusion (Tmax>6.0s) volume: 253mL Mismatch Volume: 224mL Infarction Location:Left MCA distribution IMPRESSION: 1. Perfusion abnormalities consistent with left MCA infarct with mismatch volume measuring 235 mL. 2. 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 3. 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 Posterior cerebral circulation remains patent. 4. Left carotid system atherosclerotic disease resulting and  mild common carotid stenosis, proximal occlusion of the external carotid artery and approximately 50% narrowing of the left internal carotid artery.  Critical Value/emergent results were called by telephone at the time of interpretation on 11/28/2016 at 6:35 pm to Dr. Roland Rack , who verbally acknowledged these results. Electronically Signed   By: Ulyses Jarred M.D.   On: 11/28/2016 18:59   Mr Brain Wo Contrast  Result Date: 11/29/2016 CLINICAL DATA:  Carotid endarterectomy. Acute onset AFib agent. Facial droop and dysarthria. Recent angiogram. EXAM: MRI HEAD WITHOUT CONTRAST TECHNIQUE: Multiplanar, multiecho pulse sequences of the brain and surrounding structures were obtained without intravenous contrast. COMPARISON:  Head CT 11/29/2016, cerebral angiogram 11/28/2016 FINDINGS: Brain: The midline structures are normal. There is a large region of diffusion restriction within the left MCA distribution, involving the frontal, parietal and temporal lobes, including the insular cortex. There are punctate foci of diffusion restriction within the posterior left temporal lobe and left occipital lobe and within the left caudate head. There is extensive hyperintense T2 weighted signal in the above-described distribution. There is diffuse confluent hyperintense T2-weighted signal within the periventricular and deep white matter, most often seen in the setting of chronic microvascular ischemia. There is no midline shift or significant mass effect. No evidence of hemorrhage. There is superior right parietal encephalomalacia. No hydrocephalus or extra-axial fluid collection. No age advanced or lobar predominant atrophy. Vascular: Major intracranial central arterial and venous sinus flow voids are preserved. Punctate focus of chronic microhemorrhage in the left frontal lobe. Skull and upper cervical spine: The visualized skull base, calvarium, upper cervical spine and extracranial soft tissues are normal.  Sinuses/Orbits: No fluid levels or advanced mucosal thickening. No mastoid effusion. Normal orbits. IMPRESSION: 1. Large left MCA distribution infarct involving the left insula and frontal, temporal and parietal opercula without hemorrhage or significant mass effect. 2. Punctate foci of diffusion restriction within the left caudate head, posterior left temporal lobe and left occipital lobe, likely of embolic etiology. 3. Findings of chronic microvascular ischemia. Electronically Signed   By: Ulyses Jarred M.D.   On: 11/29/2016 19:22   Ct Cerebral Perfusion W Contrast  Result Date: 11/28/2016 CLINICAL DATA:  Slurred speech EXAM: CT PERFUSION BRAIN CT angiography head and neck TECHNIQUE: Multiphase CT imaging of the brain was performed following IV bolus contrast injection. Subsequent parametric perfusion maps were calculated using RAPID software. CT angiography of the head and neck were also performed. Stenosis measurements, as reported, are calculated using the NASCET criteria. CONTRAST:  90 mL Isovue 370 IV her COMPARISON:  Head CT 11/28/2016 and 09/12/2011 FINDINGS: CTA NECK FINDINGS Aortic arch: There is atherosclerotic calcification of the aortic arch with mild-to-moderate narrowing at the arch vessel origins. Proximal subclavian arteries are patent. There is a normal 3 vessel arch configuration. The visualized proximal subclavian arteries are normal. Right carotid system: There is partially calcified plaque at the right carotid bifurcation without hemodynamically significant stenosis. Left carotid system: There is predominantly noncalcified plaque at the proximal left common carotid artery and within the distal left common carotid artery, resulting in approximately 25% narrowing of the arterial lumen. The left external carotid artery is occluded near its origin, with distal reconstitution via collateral pathways. There is predominantly noncalcified plaque along most of the course of the left internal  carotid artery resulting in stenosis that measures up to approximately 50%. Vertebral arteries: The vertebral system is left dominant. There is moderate narrowing of the left vertebral artery origin secondary to atherosclerotic plaque. The right vertebral artery is occluded at its origin and along its entire cervical length. Course and caliber of the left vertebral artery are normal  to the junction with the basilar artery. There is minimal opacification of the right V4 segment secondary to collateral flow. Skeleton: There is no bony spinal canal stenosis. No lytic or blastic lesions. Other neck: The nasopharynx is clear. The oropharynx and hypopharynx are normal. The epiglottis is normal. The supraglottic larynx, glottis and subglottic larynx are normal. No retropharyngeal collection. The parapharyngeal spaces are preserved. The parotid and submandibular glands are normal. No sialolithiasis or salivary ductal dilatation. The thyroid gland is normal. There is no cervical lymphadenopathy. Upper chest: Biapical centrilobular emphysema.  No pleural effusion. Review of the MIP images confirms the above findings CTA HEAD FINDINGS Anterior circulation: --Intracranial internal carotid arteries: There is atherosclerotic calcification of both intracranial internal carotid artery is without severe stenosis. --Anterior cerebral arteries: Normal. --Middle cerebral arteries: There is occlusion of the left MCA M2 superior division. The right MCA is normal. --Posterior communicating arteries: Present on the right. Posterior circulation: --Posterior cerebral arteries: Normal. --Superior cerebellar arteries: Normal. --Basilar artery: Normal. --Anterior inferior cerebellar arteries: Normal. --Posterior inferior cerebellar arteries: Normal. Venous sinuses: As permitted by contrast timing, patent. Anatomic variants: Predominantly fetal origin of the right posterior cerebral artery. Delayed phase: Not performed Review of the MIP images  confirms the above findings CT Brain Perfusion Findings: CBF (<30%) Volume: 56mL Perfusion (Tmax>6.0s) volume: 298mL Mismatch Volume: 268mL Infarction Location:Left MCA distribution IMPRESSION: 1. Perfusion abnormalities consistent with left MCA infarct with mismatch volume measuring 235 mL. 2. 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 3. 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 Posterior cerebral circulation remains patent. 4. Left carotid system atherosclerotic disease resulting and mild common carotid stenosis, proximal occlusion of the external carotid artery and approximately 50% narrowing of the left internal carotid artery. Critical Value/emergent results were called by telephone at the time of interpretation on 11/28/2016 at 6:35 pm to Dr. Roland Rack , who verbally acknowledged these results. Electronically Signed   By: Ulyses Jarred M.D.   On: 11/28/2016 18:59   Portable Chest Xray  Result Date: 11/30/2016 CLINICAL DATA:  82 year old male status post neurologic emergent large vessel occlusion and Neuro endovascular treatment. Intubated. Initial encounter. EXAM: PORTABLE CHEST 1 VIEW COMPARISON:  11/28/2016 and earlier. FINDINGS: Portable AP semi upright view at at 0652 hours. Endotracheal tube tip is stable up the level the clavicles. Enteric tube courses to the abdomen, side hole at the level of the gastroesophageal junction. Stable lung volumes. Mediastinal contours remain normal. Allowing for portable technique the lungs are clear. Negative visible bowel gas pattern. IMPRESSION: 1. Stable endotracheal tube position. No acute cardiopulmonary abnormality. 2. NG tube side hole remains at the level of the gastroesophageal junction, advance 5-6 cm for more optimal placement. Electronically Signed   By: Genevie Ann M.D.   On: 11/30/2016 08:04   Dg Chest Port 1 View  Result Date: 11/28/2016 CLINICAL DATA:  82 y/o  M; post  intubation. EXAM: PORTABLE CHEST 1 VIEW COMPARISON:  08/22/2011 chest radiograph FINDINGS: Clear lungs. No pleural effusion. Stable cardiac silhouette given projection and technique. Bones are unremarkable. Endotracheal tube is approximately or in 3.6 cm from the carina. IMPRESSION: No acute cardiopulmonary process. Endotracheal tube is approximately 3.6 cm from the carina. Electronically Signed   By: Kristine Garbe M.D.   On: 11/28/2016 23:23   Dg Abd Portable 1v  Result Date: 11/29/2016 CLINICAL DATA:  82 year old male status post enteric tube placement. Initial encounter. EXAM: PORTABLE ABDOMEN - 1 VIEW  COMPARISON:  Portable chest 11/28/2016 FINDINGS: Portable AP supine view at 0637 hours. Enteric tube courses to the left upper quadrant, side hole just below the level of the medial diaphragm. Visible mediastinum and lung parenchyma appears stable since yesterday. Gas and stool filled large bowel, no dilated small bowel in the visible abdomen. Left hand artifact over the left lower quadrant. No acute osseous abnormality identified. No definite pneumoperitoneum on this supine view. IMPRESSION: 1. Enteric tube side hole the level of the gastroesophageal junction. Advance 5-6 cm for more optimal placement. 2. Gas and stool filled large bowel without strong evidence of mechanical bowel obstruction at this time. Electronically Signed   By: Genevie Ann M.D.   On: 11/29/2016 08:35   Ct Head Code Stroke Wo Contrast`  Addendum Date: 11/28/2016   ADDENDUM REPORT: 11/28/2016 23:11 ADDENDUM: Though findings could represent contrast staining, this is an atypical appearance for said entity. Acute findings discussed with and reconfirmed by Dr.LINDZEN on 11/28/2016 at 11:00 pm. Electronically Signed   By: Elon Alas M.D.   On: 11/28/2016 23:11   Result Date: 11/28/2016 CLINICAL DATA:  Code stroke. Status post revascularization and clot retrieval LEFT MCA. Known occluded LEFT ECA and severe stenosis LEFT  vertebral artery. EXAM: CT HEAD WITHOUT CONTRAST TECHNIQUE: Contiguous axial images were obtained from the base of the skull through the vertex without intravenous contrast. COMPARISON:  None. FINDINGS: BRAIN: Intravascular contrast limits evaluation of intracranial hemorrhage. No lobar hematoma, midline shift or mass effect. Faint enhancement LEFT temporal lobe. Patchy to confluent supratentorial white matter hypodensities. Old bilateral basal ganglia lacunar infarcts. No abnormal extra-axial fluid collections. Basal cisterns are patent. VASCULAR: Moderate calcific atherosclerosis of the carotid siphons. Contrast opacifies LEFT middle cerebral artery. SKULL: No skull fracture. LEFT sphenoid intraosseous lipoma. Probable bone island RIGHT superolateral orbital wall. No significant scalp soft tissue swelling. SINUSES/ORBITS: Trace LEFT mastoid effusion. Mild paranasal sinus mucosal thickening without air-fluid levels. The included ocular globes and orbital contents are non-suspicious. OTHER: Patient is edentulous.  Life-support lines in place. ASPECTS [name] Stroke Program Early CT Score) - Ganglionic level infarction (caudate, lentiform nuclei, internal capsule, insula, M1-M3 cortex): 7 - Supraganglionic infarction (M4-M6 cortex): 3 Total score (0-10 with 10 being normal): 10 IMPRESSION: 1. Status post endovascular revascularization LEFT MCA, faint enhancing LEFT temporal lobe consistent with collateral vessels in the setting of acute stroke, or, can be seen in subacute infarcts. Moderate to severe chronic small vessel ischemic disease, old bilateral basal ganglia lacunar infarcts. 2. ASPECTS is 10 though, limited by presence of intravascular contrast. Referring clinician being paged by the Radiology assistant, pending return call. Electronically Signed: By: Elon Alas M.D. On: 11/28/2016 22:47   Ct Head Code Stroke Wo Contrast`  Result Date: 11/28/2016 CLINICAL DATA:  Code stroke.  Slurred speech  EXAM: CT HEAD WITHOUT CONTRAST TECHNIQUE: Contiguous axial images were obtained from the base of the skull through the vertex without intravenous contrast. COMPARISON:  Head CT 09/12/2011 FINDINGS: Brain: No mass lesion, intraparenchymal hemorrhage or extra-axial collection. No evidence of acute cortical infarct. There is loss encephalomalacia in the right parietal and occipital lobes, compatible with prior infarcts. There is an old right lenticular capsular lacunar infarct. There is periventricular hypoattenuation compatible with chronic microvascular disease. Vascular: Mild calcification of the internal carotid arteries at the skullbase. Skull: Normal visualized skull base, calvarium and extracranial soft tissues. Sinuses/Orbits: Partial opacification of the left mastoid air cells. Normal orbits. ASPECTS [name] Stroke Program Early CT Score) - Ganglionic level infarction (  caudate, lentiform nuclei, internal capsule, insula, M1-M3 cortex): 7 - Supraganglionic infarction (M4-M6 cortex): 3 Total score (0-10 with 10 being normal): 10 IMPRESSION: 1. No acute intracranial hemorrhage. Findings of multiple old infarcts and chronic microvascular ischemia. 2. ASPECTS is 10. These results were called by telephone at the time of interpretation on 11/28/2016 at 6:21 pm to Dr. Kathrynn Speed , who verbally acknowledged these results. Electronically Signed   By: Ulyses Jarred M.D.   On: 11/28/2016 18:22   Studies: CT head 12/11 >> old infarcts Echo 12/11 >> mild LVH, EF 65 to 70%, grade 1 DD MRI brain 12/12 >> Large Lt MCA infarct  Events: 12/11 Admit, cerebral angiogram >> revascularization of Lt MCA  Lines/tubes: ETT 12/11 >> 12/13  Assessment/plan:  Acute Lt MCA CVA. - per neurology  Compromised airway in setting of CVA. - proceed with extubation 12/13  Hx of HTN. - BP goals per neurology  DVT prophylaxis - SCDs SUP - protonix Nutrition - NPO Goals of care - full code  Updated pt's family at  bedside  D/w Dr. Leonie Man  CC time 32 minutes.  Chesley Mires, MD [facility] Pulmonary/Critical Care 11/30/2016, 8:57 AM Pager:  [phone] After 3pm call: [phone]

## 2016-11-30 NOTE — Progress Notes (Signed)
Patient began desatting with oxygen saturation in low 80s. Patient placed on venturi mask at 14 liters/ 55%. Daughter refusing for patient to be Nasal/tracheal suctioned. Patient pulling at oxygen mask and daughter requesting that patient be taken off of oxygen. CCM called and Dr. Oletta Darter speaking with daughter via phone. Daughter now requesting that patient be placed on nasal canula, but does not want venturi mask or for patient to be reintubated. Patient is DNR/DNI. Daughter(POA) wishes to not pursue full comfort measures at this time and does not want patient to have morphine.

## 2016-11-30 NOTE — Evaluation (Addendum)
Physical Therapy Evaluation Patient Details Name: [name] MRN: [id number] DOB: 05-07-1934 Today's Date: 11/30/2016   History of Present Illness  82 yo male s/p  Large left MCA distribution infarct involving the left insula and frontal, temporal and parietal opercula without hemorrhage or significant mass effect. Punctate foci of diffusion restriction within the left caudate head, posterior left temporal lobe and left occipital lobe, likelyof embolic etiology. Findings of chronic microvascular ischemia.  Clinical Impression  Patient presents with dense right hemiplegia, left gaze preference, global aphasia, lethargy and impaired functional mobility s/p above. Tolerated sitting EOB with total A. Pt pushing towards right. Difficulty maintaining 02 sats during activity requiring increased 02 needs up to 6 L/min 02. PTA, pt independent and lived with son. Would benefit from CIR to maximize independence and mobility prior to return home. Will follow acutely.     Follow Up Recommendations CIR    Equipment Recommendations  Other (comment) (TBA)    Recommendations for Other Services Rehab consult     Precautions / Restrictions Precautions Precautions: Fall Precaution Comments: risk for subluxation R UE  Restrictions Weight Bearing Restrictions: No      Mobility  Bed Mobility Overal bed mobility: Needs Assistance Bed Mobility: Supine to Sit;Sit to Supine;Rolling Rolling: Total assist;+2 for physical assistance   Supine to sit: +2 for physical assistance;Total assist Sit to supine: +2 for physical assistance;Total assist   General bed mobility comments: pt pushing with L UE. Helicoptor technique to get to EOB and return to supine. Sp02 dropped to 80s during session with shallow breathing andincreased RR.  Transfers                 General transfer comment: na at this time  Ambulation/Gait                Stairs            Wheelchair Mobility     Modified Rankin (Stroke Patients Only) Modified Rankin (Stroke Patients Only) Pre-Morbid Rankin Score: Slight disability Modified Rankin: Severe disability     Balance Overall balance assessment: Needs assistance Sitting-balance support: Feet supported;Single extremity supported Sitting balance-Leahy Scale: Zero Sitting balance - Comments: Pt pushing through LUE requiring Max-total A sitting EOB.  Postural control: Posterior lean;Right lateral lean                                   Pertinent Vitals/Pain Pain Assessment: Faces Faces Pain Scale: No hurt    Home Living Family/patient expects to be discharged to:: Private residence Living Arrangements: Children (sons house) Available Help at Discharge: Family Type of Home: House Home Access: Stairs to enter Entrance Stairs-Rails: Right Entrance Stairs-Number of Steps: 4-5 Home Layout: One level Home Equipment: Cane - single point Additional Comments: Pt did not drive and completed all adls mod I.     Prior Function Level of Independence: Independent         Comments: Used SPC rarely as needed for ambulation.     Hand Dominance        Extremity/Trunk Assessment   Upper Extremity Assessment Upper Extremity Assessment: Defer to OT evaluation RUE Deficits / Details: Flaccid RUE. No response to painful stimuli.    Lower Extremity Assessment Lower Extremity Assessment: RLE deficits/detail RLE Deficits / Details: flaccid; no response to painful stimuli RLE Sensation: decreased light touch RLE Coordination: decreased fine motor;decreased gross motor    Cervical / Trunk Assessment  Cervical / Trunk Assessment: Kyphotic  Communication   Communication: Expressive difficulties;Receptive difficulties  Cognition Arousal/Alertness: Lethargic Behavior During Therapy: Restless;Flat affect Overall Cognitive Status: Difficult to assess                 General Comments: Pt demonstrates global aphasia.  Pt demonstrates pushing towards right with static sitting and decr awareness to position in space. left gaze preference.     General Comments General comments (skin integrity, edema, etc.): Daughter present during session.    Exercises     Assessment/Plan    PT Assessment Patient needs continued PT services  PT Problem List Decreased strength;Decreased mobility;Decreased safety awareness;Impaired tone;Decreased range of motion;Decreased coordination;Decreased activity tolerance;Decreased cognition;Cardiopulmonary status limiting activity;Impaired sensation;Decreased balance          PT Treatment Interventions Therapeutic activities;Cognitive remediation;Patient/family education;Therapeutic exercise;Gait training;Balance training;Neuromuscular re-education;Functional mobility training;Wheelchair mobility training    PT Goals (Current goals can be found in the Care Plan section)  Acute Rehab PT Goals Patient Stated Goal: none stated by patient PT Goal Formulation: Patient unable to participate in goal setting Time For Goal Achievement: 12/14/16 Potential to Achieve Goals: Fair    Frequency Min 3X/week   Barriers to discharge        Co-evaluation PT/OT/SLP Co-Evaluation/Treatment: Yes Reason for Co-Treatment: Complexity of the patient's impairments (multi-system involvement);For patient/therapist safety;To address functional/ADL transfers PT goals addressed during session: Mobility/safety with mobility OT goals addressed during session: ADL's and self-care;Strengthening/ROM       End of Session Equipment Utilized During Treatment: Oxygen Activity Tolerance: Patient limited by lethargy Patient left: in bed;with call bell/phone within reach;with family/visitor present;with SCD's reapplied;with bed alarm set Nurse Communication: Mobility status;Need for lift equipment         Time: [id number] PT Time Calculation (min) (ACUTE ONLY): 24 min   Charges:   PT Evaluation $PT  Eval Moderate Complexity: 1 Procedure     PT G Codes:        [name] A [name] 11/30/2016, 4:16 PM [name], [place], DPT [phone]

## 2016-11-30 NOTE — Progress Notes (Signed)
Rehab Admissions Coordinator Note:  Patient was screened by Cleatrice Burke for appropriateness for an Inpatient Acute Rehab Consult per PT and OT recommendation.  At this time, we are recommending await further progress with therapy before asking for Rehab consult. Noted just extubated today.I will follow up tomorrow. NIH 23.Marland Kitchen  Cleatrice Burke 11/30/2016, 4:21 PM  I can be reached at [phone].

## 2016-11-30 NOTE — Progress Notes (Signed)
[facility] Progress Note Patient Name: [name] DOB: 01/07/1934 MRN: [id number]   Date of Service  11/30/2016  HPI/Events of Note  Spoke with the patient's daughter, Haynes Hoehn, who voices that given [name]' poor prognosis, the family wishes to move to comfort measures and allow the patient to pass with comfort and dignity.     eICU Interventions  Will make the patient comfort measures and  implement the termination of life sustaining treatment order set.      Intervention Category Major Interventions: End of life / care limitation discussion  Lysle Dingwall 11/30/2016, 10:40 PM

## 2016-12-01 DIAGNOSIS — J9601 Acute respiratory failure with hypoxia: Secondary | ICD-10-CM

## 2016-12-01 MED ORDER — BACLOFEN 10 MG PO TABS: 5.0000 mg | ORAL_TABLET | Freq: Two times a day (BID) | ORAL | Status: DC | PRN

## 2016-12-01 MED ORDER — GLYCOPYRROLATE 1 MG PO TABS: 1.0000 mg | ORAL_TABLET | ORAL | 0 refills | Status: AC | PRN

## 2016-12-01 MED ORDER — GLYCOPYRROLATE 0.2 MG/ML IJ SOLN: 0.2000 mg | INTRAMUSCULAR | Status: DC | PRN

## 2016-12-01 MED ORDER — ACETAMINOPHEN 650 MG RE SUPP: 650.0000 mg | Freq: Four times a day (QID) | RECTAL | Status: DC | PRN

## 2016-12-01 MED ORDER — MORPHINE SULFATE (CONCENTRATE) 10 MG/0.5ML PO SOLN: 5.0000 mg | ORAL | 0 refills | Status: AC | PRN

## 2016-12-01 MED ORDER — GLYCOPYRROLATE 1 MG PO TABS: 1.0000 mg | ORAL_TABLET | ORAL | Status: DC | PRN

## 2016-12-01 MED ORDER — PROCHLORPERAZINE EDISYLATE 5 MG/ML IJ SOLN: 10.0000 mg | Freq: Two times a day (BID) | INTRAMUSCULAR | Status: DC | PRN

## 2016-12-01 MED ORDER — HALOPERIDOL LACTATE 2 MG/ML PO CONC
0.5000 mg | ORAL | Status: DC | PRN
  Filled 2016-12-01: qty 0.3

## 2016-12-01 MED ORDER — PROCHLORPERAZINE MALEATE 5 MG PO TABS
5.0000 mg | ORAL_TABLET | Freq: Four times a day (QID) | ORAL | Status: DC | PRN
  Filled 2016-12-01: qty 1

## 2016-12-01 MED ORDER — PROCHLORPERAZINE 25 MG RE SUPP
25.0000 mg | Freq: Two times a day (BID) | RECTAL | Status: DC | PRN
  Filled 2016-12-01: qty 1

## 2016-12-01 MED ORDER — MORPHINE SULFATE (CONCENTRATE) 10 MG/0.5ML PO SOLN: 5.0000 mg | ORAL | Status: DC | PRN

## 2016-12-01 MED ORDER — ACETAMINOPHEN 325 MG PO TABS: 650.0000 mg | ORAL_TABLET | Freq: Four times a day (QID) | ORAL | Status: DC | PRN

## 2016-12-01 MED ORDER — BIOTENE DRY MOUTH MT LIQD: 15.0000 mL | OROMUCOSAL | Status: DC | PRN

## 2016-12-01 MED ORDER — ACETAMINOPHEN 325 MG PO TABS: 650.0000 mg | ORAL_TABLET | Freq: Four times a day (QID) | ORAL | 0 refills | Status: AC | PRN

## 2016-12-01 MED ORDER — ONDANSETRON HCL 4 MG/2ML IJ SOLN: 4.0000 mg | Freq: Four times a day (QID) | INTRAMUSCULAR | Status: DC | PRN

## 2016-12-01 MED ORDER — POLYVINYL ALCOHOL 1.4 % OP SOLN
1.0000 [drp] | Freq: Four times a day (QID) | OPHTHALMIC | Status: DC | PRN
  Filled 2016-12-01: qty 15

## 2016-12-01 MED ORDER — BACLOFEN 10 MG PO TABS: 5.0000 mg | ORAL_TABLET | Freq: Two times a day (BID) | ORAL | 0 refills | Status: AC | PRN

## 2016-12-01 MED ORDER — PROCHLORPERAZINE EDISYLATE 5 MG/ML IJ SOLN: 10.0000 mg | Freq: Two times a day (BID) | INTRAMUSCULAR | 0 refills | Status: AC | PRN

## 2016-12-01 MED ORDER — GLYCOPYRROLATE 0.2 MG/ML IJ SOLN: 0.2000 mg | INTRAMUSCULAR | 0 refills | Status: AC | PRN

## 2016-12-01 MED ORDER — HALOPERIDOL LACTATE 5 MG/ML IJ SOLN: 0.5000 mg | INTRAMUSCULAR | Status: DC | PRN

## 2016-12-01 MED ORDER — ONDANSETRON HCL 4 MG/2ML IJ SOLN: 4.0000 mg | Freq: Four times a day (QID) | INTRAMUSCULAR | 0 refills | Status: AC | PRN

## 2016-12-01 MED ORDER — HALOPERIDOL 0.5 MG PO TABS
0.5000 mg | ORAL_TABLET | ORAL | Status: DC | PRN
  Filled 2016-12-01: qty 1

## 2016-12-01 MED ORDER — HALOPERIDOL LACTATE 2 MG/ML PO CONC: 0.5000 mg | ORAL | 0 refills | Status: AC | PRN

## 2016-12-01 MED ORDER — ONDANSETRON 4 MG PO TBDP: 4.0000 mg | ORAL_TABLET | Freq: Four times a day (QID) | ORAL | Status: DC | PRN

## 2016-12-01 NOTE — Progress Notes (Signed)
STROKE TEAM PROGRESS NOTE   SUBJECTIVE (INTERVAL HISTORY) Patient was seen and examined this morning. He continues to be aphasic with dense right hemiplegia purposeful left sided movements. MRI scan of the brain showed a large left MCA infarct. He was extubated yesterday but was desaturated and tachycardic. Family made the decision to proceed with comfort care.  OBJECTIVE Temp:  [97.7 F (36.5 C)-99.8 F (37.7 C)] 97.7 F (36.5 C) (12/13 2000) Pulse Rate:  [86-132] 123 (12/14 0540) Cardiac Rhythm: Sinus tachycardia (12/13 2000) Resp:  [14-39] 20 (12/14 0540) BP: (145-198)/(65-97) 165/77 (12/14 0540) SpO2:  [64 %-98 %] 67 % (12/14 0540) FiO2 (%):  [40 %-55 %] 55 % (12/13 2040)  CBC:  Recent Labs Lab 11/28/16 1800  11/29/16 0505 11/30/16 0515  WBC 12.0*  --  16.4* 13.7*  NEUTROABS 9.0*  --  14.3*  --   HGB 16.8  < > 14.3 13.1  HCT 50.7  < > 44.0 40.4  MCV 91.7  --  90.2 90.4  PLT 400  --  368 343  < > = values in this interval not displayed.  Basic Metabolic Panel:   Recent Labs Lab 11/29/16 0505 11/30/16 0515  NA 136 138  K 3.8 3.7  CL 103 107  CO2 25 20*  GLUCOSE 120* 116*  BUN 16 13  CREATININE 1.34* 1.25*  CALCIUM 8.2* 7.8*    Lipid Panel:     Component Value Date/Time   CHOL 156 11/29/2016 0505   TRIG 154 (H) 11/29/2016 0505   HDL 37 (L) 11/29/2016 0505   CHOLHDL 4.2 11/29/2016 0505   VLDL 31 11/29/2016 0505   LDLCALC 88 11/29/2016 0505   HgbA1c:  Lab Results  Component Value Date   HGBA1C 5.9 (H) 11/29/2016   Urine Drug Screen:     Component Value Date/Time   LABOPIA NONE DETECTED 11/29/2016 0843   COCAINSCRNUR NONE DETECTED 11/29/2016 0843   LABBENZ NONE DETECTED 11/29/2016 0843   AMPHETMU NONE DETECTED 11/29/2016 0843   THCU NONE DETECTED 11/29/2016 0843   LABBARB NONE DETECTED 11/29/2016 0843     IMAGING  Ct Head Code Stroke Wo Contrast` Addendum - Though findings could represent contrast staining, this is an atypical appearance for  said entity.  11/28/2016 -  1. Status post endovascular revascularization LEFT MCA, faint enhancing LEFT temporal lobe consistent with collateral vessels in the setting of acute stroke, or, can be seen in subacute infarcts. Moderate to severe chronic small vessel ischemic disease, old bilateral basal ganglia lacunar infarcts. 2. ASPECTS is 10 though, limited by presence of intravascular contrast.   Ct Head Code Stroke Wo Contrast` 11/28/2016 1. No acute intracranial hemorrhage. Findings of multiple old infarcts and chronic microvascular ischemia. 2. ASPECTS is 10.   Ct Angio Head W Or Wo Contrast Ct Angio Neck W And/or Wo Contrast Ct Cerebral Perfusion W Contrast 11/28/2016 1. Perfusion abnormalities consistent with left MCA infarct with mismatch volume measuring 235 mL. 2. 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 3. 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 Posterior cerebral circulation remains patent. 4. Left carotid system atherosclerotic disease resulting and mild common carotid stenosis, proximal occlusion of the external carotid artery and approximately 50% narrowing of the left internal carotid artery.   Ct Head Wo Contrast 11/29/2016 1. Cortical hypoattenuation with mild local mass effect involving the left insula and frontal operculum compatible with infarction. No intracranial hemorrhage identified. 2. Stable background of moderate to  severe chronic microvascular ischemic changes and mild brain parenchymal volume loss.   Dg Chest Port 1 View 11/29/2016 1. Stable endotracheal tube position. No acute cardiopulmonary abnormality. 2. NG tube side hole remains at the level of the gastroesophageal junction, advance 5-6 cm for more optimal placement. 11/28/2016 No acute cardiopulmonary process. Endotracheal tube is approximately 3.6 cm from the carina.   Dg Abd Portable 1v 11/29/2016 1. Enteric tube side hole the  level of the gastroesophageal junction. Advance 5-6 cm for more optimal placement. 2. Gas and stool filled large bowel without strong evidence of mechanical bowel obstruction at this time.   MRI 11/29/2016 1. Large left MCA distribution infarct involving the left insula and frontal, temporal and parietal opercula without hemorrhage or significant mass effect. 2. Punctate foci of diffusion restriction within the left caudate head, posterior left temporal lobe and left occipital lobe, likelyof embolic etiology. 3. Findings of chronic microvascular ischemia.  PHYSICAL EXAM Vitals:   12/01/16 0300 12/01/16 0400 12/01/16 0500 12/01/16 0540  BP:    (!) 165/77  Pulse: (!) 119 (!) 120 (!) 127 (!) 123  Resp: (!) 26 (!) 26 (!) 29 20  Temp:      TempSrc:      SpO2: (!) 76% (!) 78% (!) 64% (!) 67%  Weight:      Height:       General: Vital signs reviewed.  Patient is well-developed and well-nourished, in no acute distress and cooperative with exam.  Head: Normocephalic and atraumatic. Eyes: EOMI, conjunctivae normal, no scleral icterus.  Neck: Supple, trachea midline, normal ROM, no JVD, masses, thyromegaly, or carotid bruit present.  Cardiovascular: RRR, S1 normal, S2 normal, no murmurs, gallops, or rubs. Pulmonary/Chest: Clear to auscultation bilaterally, no wheezes, rales, or rhonchi. Abdominal: Soft, non-tender, non-distended, BS +, no masses, organomegaly, or guarding present.  Musculoskeletal: No joint deformities, erythema, or stiffness, ROM full and nontender. Extremities: No lower extremity edema bilaterally,  pulses symmetric and intact bilaterally. No cyanosis or clubbing.  Neurological Exam :  Patient is stuporose and unresponsive on morphine drip now. Responds to sternal rub with purposeful movements in all 4 limbs but not speaking and globally aphasicl.      ASSESSMENT/PLAN [name] is a 82 y.o. male with history of L CEA and mild memory loss presenting with aphasia,  facial droop and dysarthria. He did not receive IV t-PA due to delay in arrival. He was sent to IR where he received TICI 2b revascularization of L MCA with solitaire and IA integrelin    Stroke:  Left MCA infarct s/p TICI 2b revascularization of L MCA with solitaire and IA integrelin. infarct embolic secondary to unknown source   Resultant  right hemiparesis and aphasia  CTA head and neck/perfusion - left MCA mismatch. Complete L M2 occlusion. Complete R VA occlusion. L ICA atherosclerosis with proximal occlusion of the ECA and L ICA 50% narrowing   Cerebral angio TICI 2b revascularization of L MCA with solitaire and IA integrelin. mod severe diffuse intracranial atherosclerosis, L VA 85-90% stenosis, occluded L ECA, and L VBJ 60% stenosis.   Post intervention CT no hemorrhage. Small vessel disease. Left insula and frontal infarct MRI  1. Large left MCA distribution infarct involving the left insula and frontal, temporal and parietal opercula without hemorrhage or significant mass effect. 2. Punctate foci of diffusion restriction within the left caudate head, posterior left temporal lobe and left occipital lobe, likely  of embolic etiology 2D Echo  Left ventricle:  The cavity size was normal. Wall thickness was   increased in a pattern of mild LVH. Systolic function was   vigorous. The estimated ejection fraction was in the range of 65%   to 70%. Wall motion was normal; there were no regional wall    motion abnormalities  LDL 88  HgbA1c 5.9  SCDs for VTE prophylaxis Diet NPO time specified  No antithrombotic listed on med rec prior to admission, now on  Aspirin 300 mg suppository  Ongoing aggressive stroke risk factor management  Therapy recommendations: pending  Disposition:  pending  Hyperlipidemia  Home meds:  No statin  LDL 88, goal < 70  Add statin once able to swallow  Continue statin at discharge  Other Stroke Risk Factors  Advanced age  Former Cigarette  smoker  PAD  History carotid stenosis status post left CEA 03/2016  Other Active Problems  Baseline mild memory deficit, followed by Dr. Leonie Man as outpatient   History of depression, on Celexa in the past but nothing currently  Hospital day # [place], DO PGY-3 Internal Medicine Resident Pager # [phone] 12/01/2016 7:48 AM I have personally examined this patient, reviewed notes, independently viewed imaging studies, participated in medical decision making and plan of care.ROS completed by me personally and pertinent positives fully documented  I have made any additions or clarifications directly to the above note. Agree with note above.  I spoke to the patient's wife, brother in law at the bedside and answered questions. Family is comfortable with the decision on comfort care. They're agreeable to transfer to [facility] when bed available. Continue morphine for comfort. Transfer to palliative care floor bed.  Antony Contras, MD Medical Director [facility] Stroke Center Pager: [phone] 12/01/2016 12:57 PM   To contact Stroke Continuity provider, please refer to [email or web address] After hours, contact General Neurology

## 2016-12-01 NOTE — Progress Notes (Signed)
Progressive hypoxia and respiratory distress.  Transitioned to comfort measures overnight.  PCCM will sign off.  Chesley Mires, MD [facility] Pulmonary/Critical Care 12/01/2016, 8:02 AM Pager:  [phone] After 3pm call: [phone]

## 2016-12-01 NOTE — Progress Notes (Signed)
Responded to consult for end of life. Pt was transitioned to comfort care around midnight last night. Daughter and another family member in rm since then. Provided spiritual/emotional support and prayer. Daughter was grateful, as she told me through her tears that she'd just been thinking of the chaplain, that it would be nice then to have a little prayer. Advised that she can ask nurse to page a chaplain at any time for prayer or to be with family when or after pt passes. Chaplain available for f/u.     12/01/16 0900  Clinical Encounter Type  Visited With Patient and family together  Visit Type Initial;Psychological support;Spiritual support;Social support;Critical Care;Other (Comment) (pt at end of life, now comfort care)  Referral From Nurse  Spiritual Encounters  Spiritual Needs Prayer;Emotional;Grief support  Stress Factors  Patient Stress Factors Other (Comment)  Family Stress Factors (end of life)   Gerrit Heck, Chaplain

## 2016-12-01 NOTE — Discharge Summary (Signed)
Name: [name] MRN: [id number] DOB: 08/11/1934 82 y.o. 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 status post mechanical thrombectomy with revascularization but significant residual deficits with aphasia and right hemiplegia  Active Problems:   Cerebrovascular accident (CVA) ([place])   Coarctation of aorta, recurrent, post-intervention   History of ETT   Discharge Medications:   Medication List    STOP taking these medications   Biotin 5000 MCG Caps   Chlorpheniramine-DM 4-30 MG Tabs   [employer] FISH OIL 1000 MG Caps   hydrochlorothiazide 12.5 MG capsule Commonly known as:  MICROZIDE   MELATONIN PO   METAFOLBIC PLUS 6-2-600 MG Tabs   metoprolol tartrate 25 MG tablet Commonly known as:  LOPRESSOR   oxyCODONE-acetaminophen 5-325 MG tablet Commonly known as:  PERCOCET/ROXICET   PROBIOTIC DAILY PO   vitamin A 10000 UNIT capsule   vitamin C 500 MG tablet Commonly known as:  ASCORBIC ACID     TAKE these medications   acetaminophen 325 MG tablet Commonly known as:  TYLENOL Take 2 tablets (650 mg total) by mouth every 6 (six) hours as needed for mild pain (or Fever >/= 101).   baclofen 10 MG tablet Commonly known as:  LIORESAL Take 0.5 tablets (5 mg total) by mouth every 12 (twelve) hours as needed for muscle spasms (hiccoughs).   glycopyrrolate 0.2 MG/ML injection Commonly known as:  ROBINUL Inject 1 mL (0.2 mg total) into the skin every 4 (four) hours as needed (excessive secretions).   glycopyrrolate 1 MG tablet Commonly known as:  ROBINUL Take 1 tablet (1 mg total) by mouth every 4 (four) hours as needed (excessive secretions).   haloperidol 2 MG/ML solution Commonly known as:  HALDOL Place 0.3 mLs (0.6 mg total) under the tongue every 4 (four) hours as  needed for agitation (or delirium).   morphine CONCENTRATE 10 MG/0.5ML Soln concentrated solution Take 0.25 mLs (5 mg total) by mouth every 2 (two) hours as needed for moderate pain (or dyspnea).   morphine CONCENTRATE 10 MG/0.5ML Soln concentrated solution Place 0.25 mLs (5 mg total) under the tongue every 2 (two) hours as needed for moderate pain (or dyspnea).   ondansetron 4 MG/2ML Soln injection Commonly known as:  ZOFRAN Inject 2 mLs (4 mg total) into the vein every 6 (six) hours as needed for nausea.   prochlorperazine 5 MG/ML injection Commonly known as:  COMPAZINE Inject 2 mLs (10 mg total) into the vein every 12 (twelve) hours as needed.       Disposition and follow-up:   [name] was discharged from [facility] to [place] in Critical condition.     Hospital Course by problem list: Active Problems:   Cerebrovascular accident (CVA) [facility])   Coarctation of aorta, recurrent, post-intervention   History of ETT   [name] a 82 y.o.malewith a history of carotid endarterectomy on the left in April who presents with aphasia. He last spoke to someone around 1 PM, and subsequently was noticed by his family that he was speaking nonsensicalstatements. On arrival, he had some facial droop, aphasia and dysarthria. In the emergency department he did wax and wane, improving some and then worsening at times.  Due to his symptoms, a CT perfusion was obtained which did demonstrate a very large penumbra with  an M2 occlusion.  Dr. Leonel Ramsay had a long discussion with family, there was some delay in decision-making due to the time spent discussing it with family and discussion amongst the family. After presenting options of IR vsconservative management in the ICU the family agreed to proceed with intervention.  He was LKW at  1 PM 11/28/2016. Premorbid Modified Rankin score: 1. Patient was not administered IV t-PA secondary to  being out of window. He was taken to IR were he received TICI 2b revascularization of L MCA with solitaire and 7mg  IA integrelin. Also found to have mod severe diffuse intracranial atherosclerosis, L VA 85-90% stenosis, occluded L ECA, and L VBJ 60% stenosis.  He was admitted to the neuro ICU for further evaluation and treatment. Despite intervention, patient has sustained a large stroke and continued to be hemiplegic, aphasic, and in respiratory distress. Patient had stated in his wishes that he did not want to remain intubated and wanted to be a DNR. Per family's wishes, patient was extubated and continued to decline. He became agitated, short of breath, tachycardic and tachypneic. Family made the decision to switch him to comfort care. Patient was accepted to [facility] on 12/01/16. I have continued comfort medications on discharge.  Discharge Vitals:   BP (!) 165/77   Pulse (!) 123   Temp 97.7 F (36.5 C) (Axillary)   Resp 20   Ht 5\' 11"  (1.803 m)   Wt 160 lb 4.4 oz (72.7 kg)   SpO2 (!) 67%   BMI 22.35 kg/m    Signed: Martyn Malay, DO PGY-3 Internal Medicine Resident Pager # [phone] 12/01/2016 1:21 PM I have personally examined this patient, reviewed notes, independently viewed imaging studies, participated in medical decision making and plan of care.ROS completed by me personally and pertinent positives fully documented  I have made any additions or clarifications directly to the above note. Agree with note above.  Total time spent on discharge summary 35 minutes Antony Contras, [facility] Pager: [phone] 12/02/2016 1:51 PM

## 2016-12-01 NOTE — Progress Notes (Signed)
Report called to [facility] at [facility]. Family aware of plan to transfer to [facility]. Will transfer once all d/c paperwork complete.

## 2016-12-01 NOTE — Clinical Social Work Note (Signed)
Clinical Social Worker facilitated patient discharge including contacting patient family and facility to confirm patient discharge plans.  Clinical information faxed to facility and family agreeable with plan.  CSW arranged ambulance transport via [facility] to [facility].  RN to call report prior to discharge.  Clinical Social Worker will sign off for now as social work intervention is no longer needed. Please consult us again if new need arises.  Jesse Madelynne Lasker, LCSW [phone] 

## 2016-12-01 NOTE — Progress Notes (Signed)
Wasted 55cc of Morphine gtt with Eber Hong, RN

## 2016-12-01 NOTE — H&P (Signed)
Pt being transported by [facility] to [facility] at this time.  Family updated.  Pt on RA; O2 sat 67 at this time.  Will continue to support family.

## 2016-12-01 NOTE — Care Management Note (Signed)
Case Management Note  Patient Details  Name: [name] MRN: [id number] Date of Birth: 1934-08-05  Subjective/Objective:  Pt transitioned to comfort measures overnight.                    Action/Plan: CSW following to assist with transition to [facility] facility; appears bed available today.    Expected Discharge Date:      12/01/16            Expected Discharge Plan:  [place]  In-House Referral:  Clinical Social Work  Discharge planning Services  CM Consult  Post Acute Care Choice:    Choice offered to:     DME Arranged:    DME Agency:     HH Arranged:    [place] Agency:     Status of Service:  Completed, signed off  If discussed at [employer], dates discussed:    Additional Comments:  Ella Bodo, RN 12/01/2016, 11:47 AM

## 2016-12-01 NOTE — Consult Note (Signed)
[facility] [employer] Received request from [place] for family interest in [name]. Chart reviewed and met with family at 11:15 to complete paper work for transfer today. Dr. Orpah Melter to assume care per family request. Provided RN with number to call report [phone].  Please fax discharge summary to [phone].  Thank you,  Chanse Kagel, LCSW [phone]

## 2016-12-08 ENCOUNTER — Telehealth (HOSPITAL_COMMUNITY): Payer: Self-pay

## 2016-12-08 NOTE — Telephone Encounter (Signed)
Called to schedule f/u, left message for pt to return call. AW 

## 2016-12-13 ENCOUNTER — Telehealth (HOSPITAL_COMMUNITY): Payer: Self-pay

## 2016-12-13 NOTE — Telephone Encounter (Signed)
Returned pt's phone call. Left message for pt to return call. AW

## 2016-12-19 DEATH — deceased

## 2017-02-14 ENCOUNTER — Ambulatory Visit: Payer: Self-pay | Admitting: Neurology

## 2018-05-13 IMAGING — CT CT HEAD CODE STROKE
3 series · 14 of 47 positions shown, 16 images · non-contrast
Comparison: None.

ADDENDUM:
Though findings could represent contrast staining, this is an
atypical appearance for said entity.

Acute findings discussed with and reconfirmed by Dr.TORUA on
CLINICAL DATA: Code stroke. Status post revascularization and clot
retrieval LEFT MCA. Known occluded LEFT ECA and severe stenosis LEFT
vertebral artery.
EXAM:
CT HEAD WITHOUT CONTRAST
TECHNIQUE: Contiguous axial images were obtained from the base of the skull
through the vertex without intravenous contrast.

[Series 2: head 5.0 h30s · axial · 0.48mm/px · z∈[+277,+422]mm · 8 of 35 slices shown, 10 images]
[im 3/35  brain]
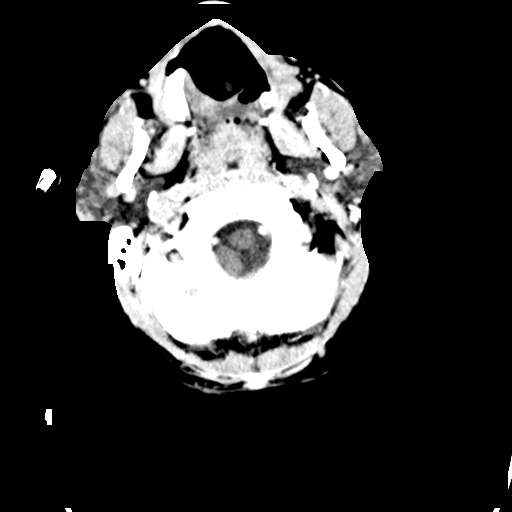
[im 3/35  bone]
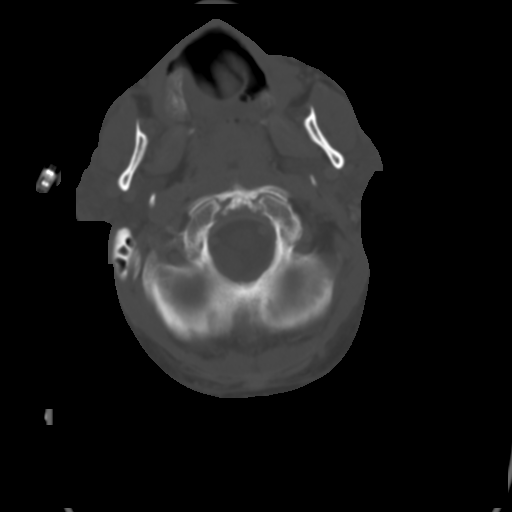
[im 8/35  brain]
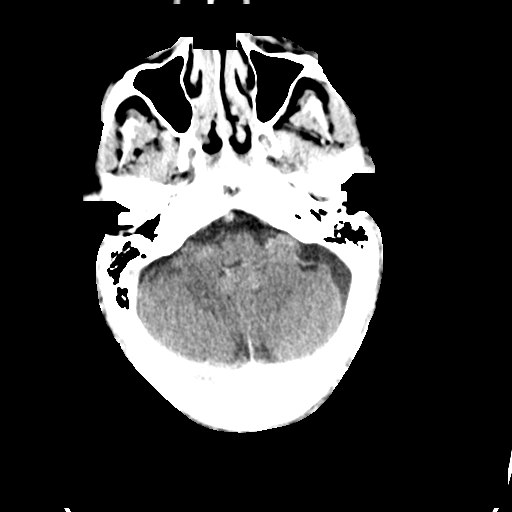
[im 11/35  brain]
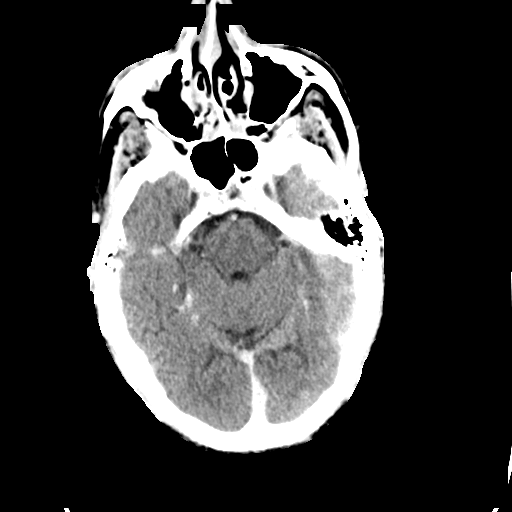
[im 16/35  brain]
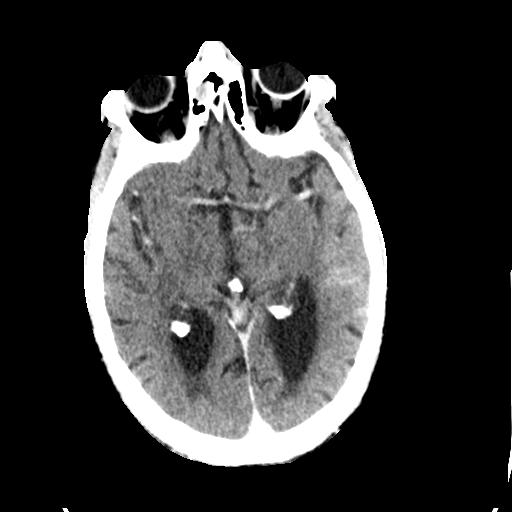
[im 19/35  brain]
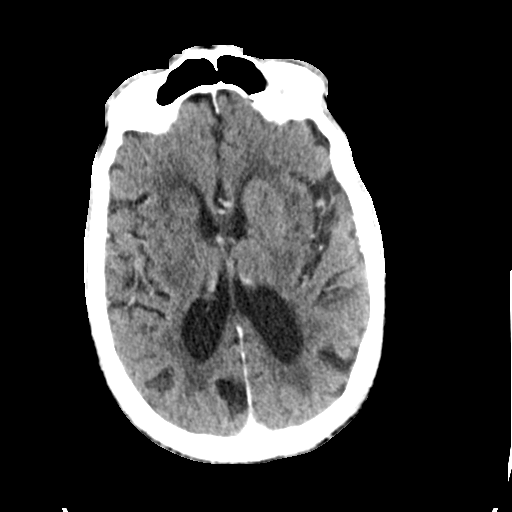
[im 19/35  bone]
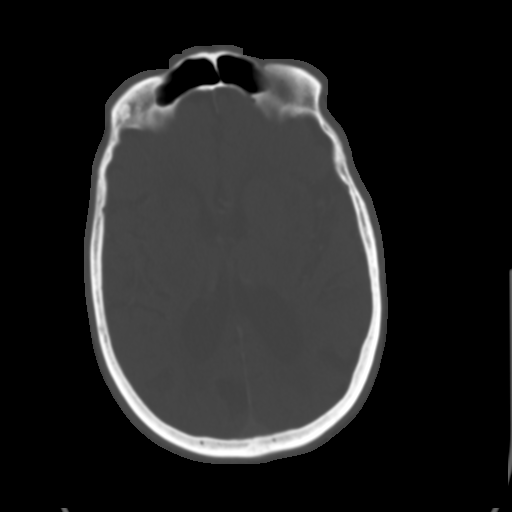
[im 24/35  brain]
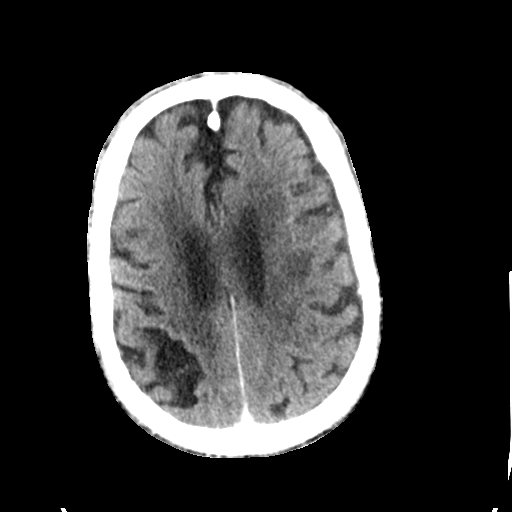
[im 27/35  brain]
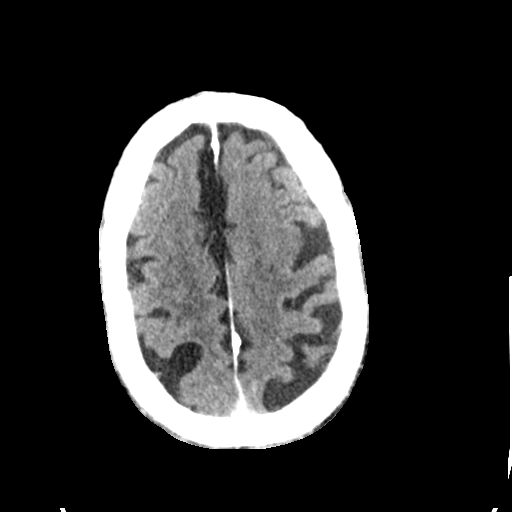
[im 32/35  brain]
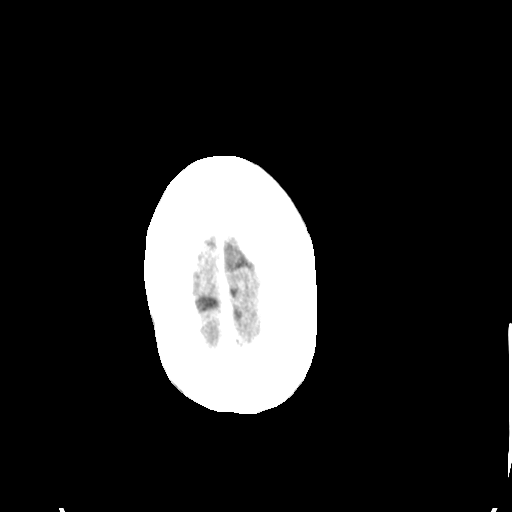

[Series 4: head 3.0 mpr cor · coronal · 0.34mm/px · 3 of 78 slices shown]
[im 26/78  brain]
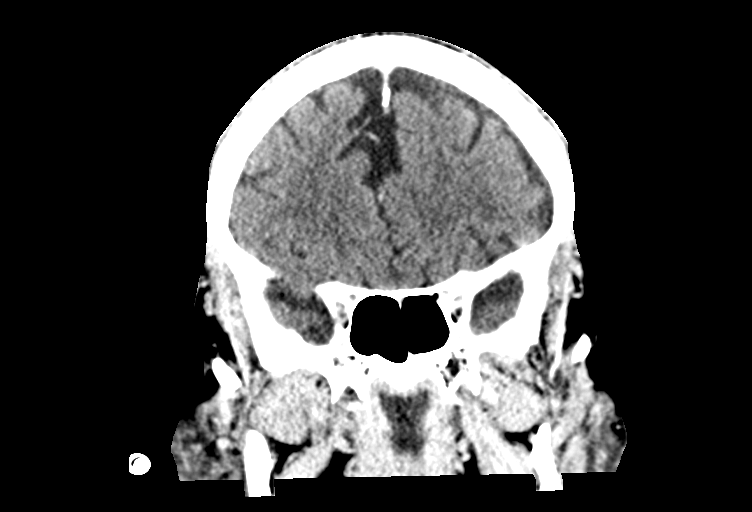
[im 35/78  brain]
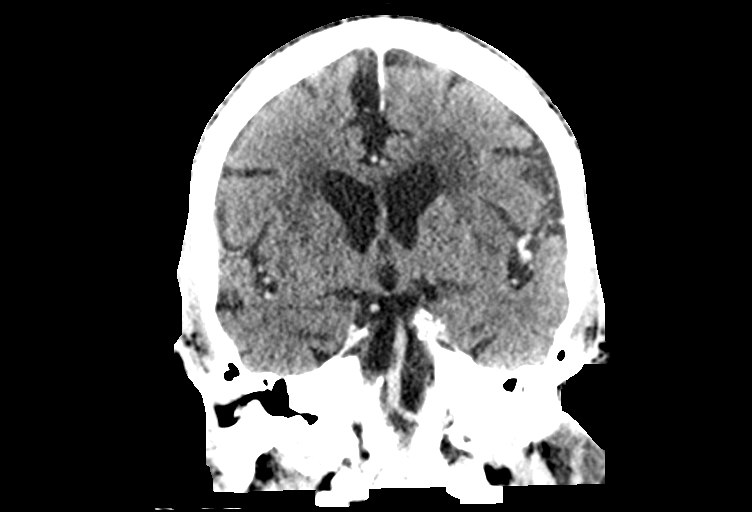
[im 43/78  brain]
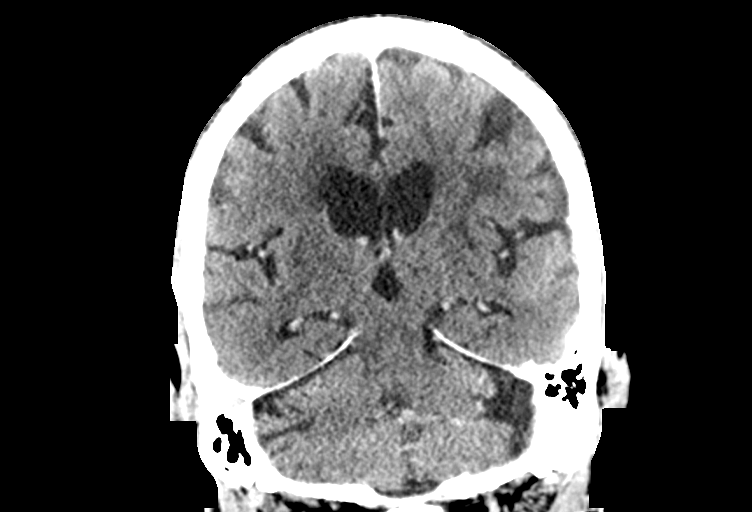

[Series 5: head 3.0 mpr sag · sagittal · 0.37mm/px · 3 of 67 slices shown]
[im 23/67  brain]
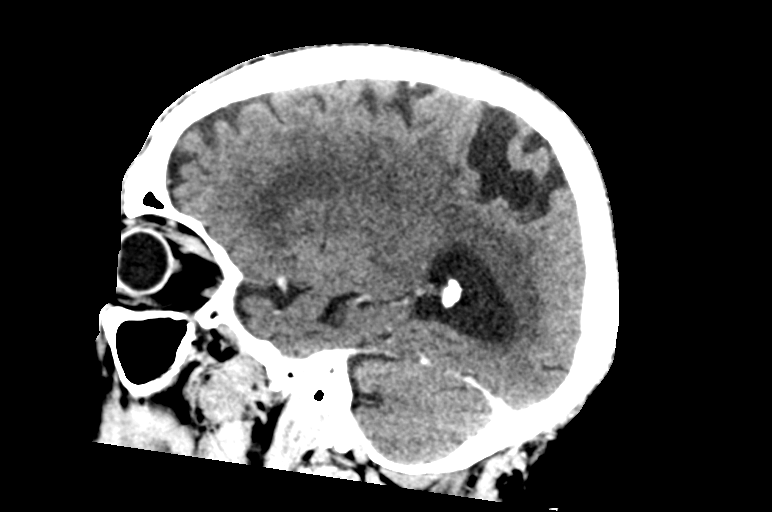
[im 34/67  brain]
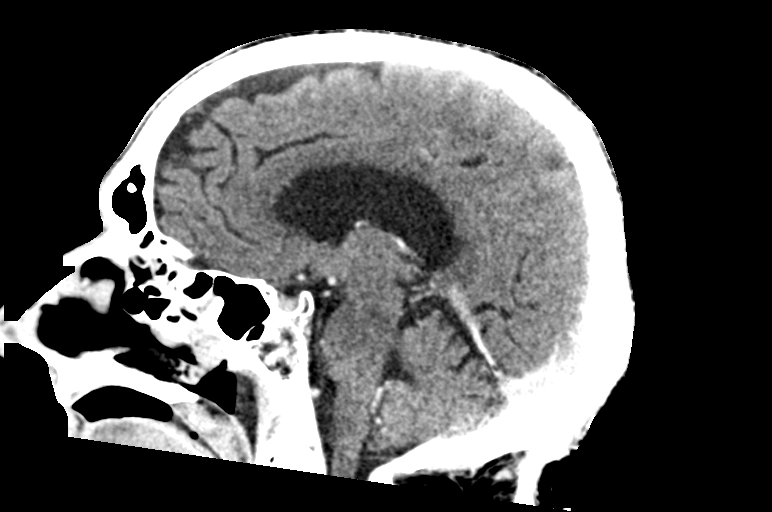
[im 45/67  brain]
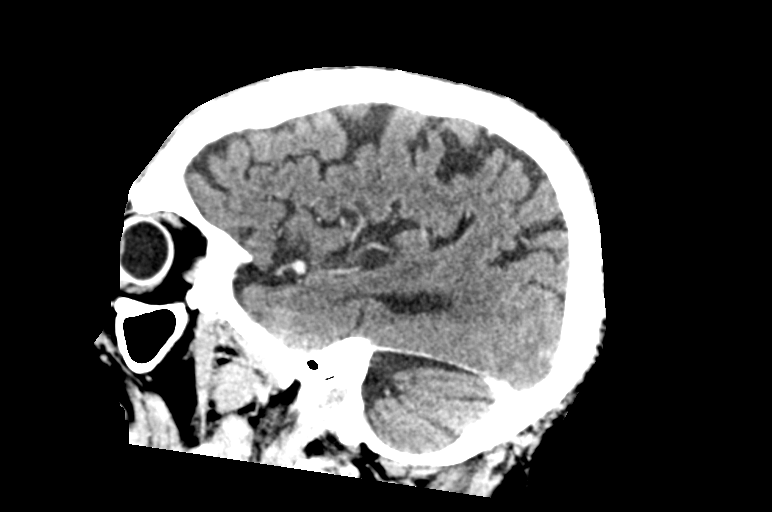

[14 of 47 positions shown; findings below may reference images not displayed]

FINDINGS: BRAIN: Intravascular contrast limits evaluation of intracranial
hemorrhage. No lobar hematoma, midline shift or mass effect. Faint
enhancement LEFT temporal lobe. Patchy to confluent supratentorial
white matter hypodensities. Old bilateral basal ganglia lacunar
infarcts. No abnormal extra-axial fluid collections. Basal cisterns
are patent.

VASCULAR: Moderate calcific atherosclerosis of the carotid siphons.
Contrast opacifies LEFT middle cerebral artery.

SKULL: No skull fracture. LEFT sphenoid intraosseous lipoma.
Probable bone island RIGHT superolateral orbital wall. No
significant scalp soft tissue swelling.

SINUSES/ORBITS: Trace LEFT mastoid effusion. Mild paranasal sinus
mucosal thickening without air-fluid levels. The included ocular
globes and orbital contents are non-suspicious.

OTHER: Patient is edentulous.  Life-support lines in place.

ASPECTS ([place] Stroke Program Early CT Score)

- Ganglionic level infarction (caudate, lentiform nuclei, internal
capsule, insula, M1-M3 cortex): 7

- Supraganglionic infarction (M4-M6 cortex): 3

Total score (0-10 with 10 being normal): 10
IMPRESSION: 1. Status post endovascular revascularization LEFT MCA, faint
enhancing LEFT temporal lobe consistent with collateral vessels in
the setting of acute stroke, or, can be seen in subacute infarcts.

Moderate to severe chronic small vessel ischemic disease, old
bilateral basal ganglia lacunar infarcts.

2. ASPECTS is 10 though, limited by presence of intravascular
contrast.
Referring clinician being paged by the Radiology assistant, pending
return call.
